# Patient Record
Sex: Female | Born: 1955 | Race: White | Hispanic: No | Marital: Married | Smoking: Never smoker
Health system: Southern US, Community
[De-identification: ages and names within clinical notes are randomized; demographics above are authoritative.]

## PROBLEM LIST (undated history)

## (undated) DIAGNOSIS — R519 Headache, unspecified: Secondary | ICD-10-CM

## (undated) DIAGNOSIS — G4730 Sleep apnea, unspecified: Secondary | ICD-10-CM

## (undated) DIAGNOSIS — R51 Headache: Secondary | ICD-10-CM

## (undated) DIAGNOSIS — I10 Essential (primary) hypertension: Secondary | ICD-10-CM

## (undated) DIAGNOSIS — E7800 Pure hypercholesterolemia, unspecified: Secondary | ICD-10-CM

## (undated) DIAGNOSIS — I8500 Esophageal varices without bleeding: Secondary | ICD-10-CM

## (undated) DIAGNOSIS — M1990 Unspecified osteoarthritis, unspecified site: Secondary | ICD-10-CM

## (undated) DIAGNOSIS — Z9289 Personal history of other medical treatment: Secondary | ICD-10-CM

## (undated) DIAGNOSIS — D649 Anemia, unspecified: Secondary | ICD-10-CM

## (undated) DIAGNOSIS — R0600 Dyspnea, unspecified: Secondary | ICD-10-CM

## (undated) DIAGNOSIS — M797 Fibromyalgia: Secondary | ICD-10-CM

## (undated) DIAGNOSIS — E039 Hypothyroidism, unspecified: Secondary | ICD-10-CM

## (undated) DIAGNOSIS — K7460 Unspecified cirrhosis of liver: Secondary | ICD-10-CM

## (undated) DIAGNOSIS — R011 Cardiac murmur, unspecified: Secondary | ICD-10-CM

## (undated) DIAGNOSIS — K219 Gastro-esophageal reflux disease without esophagitis: Secondary | ICD-10-CM

## (undated) DIAGNOSIS — Z9989 Dependence on other enabling machines and devices: Secondary | ICD-10-CM

## (undated) DIAGNOSIS — H9190 Unspecified hearing loss, unspecified ear: Secondary | ICD-10-CM

## (undated) HISTORY — PX: COLONOSCOPY: SHX174

## (undated) HISTORY — PX: TONSILLECTOMY: SUR1361

## (undated) HISTORY — PX: UPPER ENDOSCOPY W/ BANDING: SHX2603

## (undated) HISTORY — PX: WRIST FRACTURE SURGERY: SHX121

## (undated) HISTORY — PX: OTHER SURGICAL HISTORY: SHX169

## (undated) HISTORY — PX: INCONTINENCE SURGERY: SHX676

## (undated) HISTORY — PX: ABDOMINAL HYSTERECTOMY: SHX81

## (undated) HISTORY — PX: CARPAL TUNNEL RELEASE: SHX101

---

## 2010-03-08 ENCOUNTER — Encounter: Admission: RE | Admit: 2010-03-08 | Discharge: 2010-03-08 | Payer: Self-pay | Admitting: Sports Medicine

## 2012-06-08 ENCOUNTER — Other Ambulatory Visit: Payer: Self-pay | Admitting: Physician Assistant

## 2012-06-08 DIAGNOSIS — Z780 Asymptomatic menopausal state: Secondary | ICD-10-CM

## 2012-06-11 ENCOUNTER — Ambulatory Visit
Admission: RE | Admit: 2012-06-11 | Discharge: 2012-06-11 | Disposition: A | Discharge status: Home / Self Care | Payer: BC Managed Care – PPO | Source: Ambulatory Visit | Attending: Physician Assistant | Admitting: Physician Assistant

## 2012-06-11 DIAGNOSIS — Z780 Asymptomatic menopausal state: Secondary | ICD-10-CM

## 2015-01-26 ENCOUNTER — Emergency Department (HOSPITAL_COMMUNITY): Payer: BLUE CROSS/BLUE SHIELD

## 2015-01-26 ENCOUNTER — Encounter (HOSPITAL_COMMUNITY): Payer: Self-pay | Admitting: Emergency Medicine

## 2015-01-26 ENCOUNTER — Observation Stay (HOSPITAL_COMMUNITY)
Admission: EM | Admit: 2015-01-26 | Discharge: 2015-01-27 | Disposition: A | Discharge status: Home / Self Care | Payer: BLUE CROSS/BLUE SHIELD | Attending: Internal Medicine | Admitting: Internal Medicine

## 2015-01-26 DIAGNOSIS — E039 Hypothyroidism, unspecified: Secondary | ICD-10-CM | POA: Diagnosis not present

## 2015-01-26 DIAGNOSIS — E780 Pure hypercholesterolemia: Secondary | ICD-10-CM | POA: Diagnosis not present

## 2015-01-26 DIAGNOSIS — K219 Gastro-esophageal reflux disease without esophagitis: Secondary | ICD-10-CM | POA: Insufficient documentation

## 2015-01-26 DIAGNOSIS — R0789 Other chest pain: Secondary | ICD-10-CM | POA: Diagnosis not present

## 2015-01-26 DIAGNOSIS — R079 Chest pain, unspecified: Secondary | ICD-10-CM | POA: Diagnosis present

## 2015-01-26 DIAGNOSIS — G2581 Restless legs syndrome: Secondary | ICD-10-CM

## 2015-01-26 DIAGNOSIS — R740 Nonspecific elevation of levels of transaminase and lactic acid dehydrogenase [LDH]: Secondary | ICD-10-CM

## 2015-01-26 DIAGNOSIS — Z23 Encounter for immunization: Secondary | ICD-10-CM | POA: Diagnosis not present

## 2015-01-26 DIAGNOSIS — Z79899 Other long term (current) drug therapy: Secondary | ICD-10-CM

## 2015-01-26 DIAGNOSIS — R42 Dizziness and giddiness: Secondary | ICD-10-CM | POA: Diagnosis not present

## 2015-01-26 DIAGNOSIS — R0602 Shortness of breath: Secondary | ICD-10-CM | POA: Insufficient documentation

## 2015-01-26 DIAGNOSIS — I10 Essential (primary) hypertension: Secondary | ICD-10-CM | POA: Insufficient documentation

## 2015-01-26 DIAGNOSIS — Z880 Allergy status to penicillin: Secondary | ICD-10-CM | POA: Diagnosis not present

## 2015-01-26 DIAGNOSIS — M797 Fibromyalgia: Secondary | ICD-10-CM

## 2015-01-26 HISTORY — DX: Pure hypercholesterolemia, unspecified: E78.00

## 2015-01-26 HISTORY — DX: Essential (primary) hypertension: I10

## 2015-01-26 LAB — CBC
HCT: 42.4 % (ref 36.0–46.0)
HEMOGLOBIN: 15.1 g/dL — AB (ref 12.0–15.0)
MCH: 33.5 pg (ref 26.0–34.0)
MCHC: 35.6 g/dL (ref 30.0–36.0)
MCV: 94 fL (ref 78.0–100.0)
Platelets: 206 10*3/uL (ref 150–400)
RBC: 4.51 MIL/uL (ref 3.87–5.11)
RDW: 13.2 % (ref 11.5–15.5)
WBC: 6.7 10*3/uL (ref 4.0–10.5)

## 2015-01-26 LAB — BASIC METABOLIC PANEL
Anion gap: 10 (ref 5–15)
BUN: 9 mg/dL (ref 6–20)
CALCIUM: 9.6 mg/dL (ref 8.9–10.3)
CHLORIDE: 103 mmol/L (ref 101–111)
CO2: 26 mmol/L (ref 22–32)
Creatinine, Ser: 0.69 mg/dL (ref 0.44–1.00)
GFR calc non Af Amer: >60 mL/min (ref >60)
GLUCOSE: 106 mg/dL — AB (ref 65–99)
Potassium: 3.2 mmol/L — ABNORMAL LOW (ref 3.5–5.1)
SODIUM: 139 mmol/L (ref 135–145)

## 2015-01-26 LAB — I-STAT TROPONIN, ED: TROPONIN I, POC: 0 ng/mL (ref 0.00–0.08)

## 2015-01-26 LAB — TROPONIN I

## 2015-01-26 MED ORDER — GABAPENTIN 100 MG PO CAPS
100.0000 mg | ORAL_CAPSULE | Freq: Three times a day (TID) | ORAL | Status: DC
  Administered 2015-01-26: 100 mg via ORAL
  Filled 2015-01-26: qty 1

## 2015-01-26 MED ORDER — MORPHINE SULFATE 2 MG/ML IJ SOLN: 1.0000 mg | INTRAMUSCULAR | Status: DC | PRN

## 2015-01-26 MED ORDER — SODIUM CHLORIDE 0.9 % IJ SOLN
3.0000 mL | Freq: Two times a day (BID) | INTRAMUSCULAR | Status: DC
  Administered 2015-01-26 – 2015-01-27 (×2): 3 mL via INTRAVENOUS

## 2015-01-26 MED ORDER — ONDANSETRON HCL 4 MG PO TABS: 4.0000 mg | ORAL_TABLET | Freq: Four times a day (QID) | ORAL | Status: DC | PRN

## 2015-01-26 MED ORDER — PNEUMOCOCCAL VAC POLYVALENT 25 MCG/0.5ML IJ INJ
0.5000 mL | INJECTION | INTRAMUSCULAR | Status: AC
  Administered 2015-01-27: 0.5 mL via INTRAMUSCULAR
  Filled 2015-01-26: qty 0.5

## 2015-01-26 MED ORDER — GI COCKTAIL ~~LOC~~: 30.0000 mL | Freq: Three times a day (TID) | ORAL | Status: DC | PRN

## 2015-01-26 MED ORDER — POTASSIUM CHLORIDE CRYS ER 20 MEQ PO TBCR
40.0000 meq | EXTENDED_RELEASE_TABLET | Freq: Once | ORAL | Status: AC
  Administered 2015-01-26: 40 meq via ORAL
  Filled 2015-01-26: qty 2

## 2015-01-26 MED ORDER — ONDANSETRON HCL 4 MG/2ML IJ SOLN: 4.0000 mg | Freq: Four times a day (QID) | INTRAMUSCULAR | Status: DC | PRN

## 2015-01-26 MED ORDER — PANTOPRAZOLE SODIUM 40 MG PO TBEC
40.0000 mg | DELAYED_RELEASE_TABLET | Freq: Every day | ORAL | Status: DC
  Administered 2015-01-26 – 2015-01-27 (×2): 40 mg via ORAL
  Filled 2015-01-26 (×2): qty 1

## 2015-01-26 MED ORDER — LEVOTHYROXINE SODIUM 100 MCG PO TABS
100.0000 ug | ORAL_TABLET | Freq: Every day | ORAL | Status: DC
  Administered 2015-01-27: 100 ug via ORAL
  Filled 2015-01-26: qty 1

## 2015-01-26 MED ORDER — DULOXETINE HCL 60 MG PO CPEP
60.0000 mg | ORAL_CAPSULE | Freq: Every day | ORAL | Status: DC
  Administered 2015-01-27: 60 mg via ORAL
  Filled 2015-01-26: qty 1

## 2015-01-26 MED ORDER — ROPINIROLE HCL 1 MG PO TABS
4.0000 mg | ORAL_TABLET | Freq: Every day | ORAL | Status: DC
  Administered 2015-01-26: 4 mg via ORAL
  Filled 2015-01-26: qty 4

## 2015-01-26 MED ORDER — ENOXAPARIN SODIUM 40 MG/0.4ML ~~LOC~~ SOLN
40.0000 mg | SUBCUTANEOUS | Status: DC
  Administered 2015-01-26: 40 mg via SUBCUTANEOUS
  Filled 2015-01-26: qty 0.4

## 2015-01-26 MED ORDER — GI COCKTAIL ~~LOC~~
30.0000 mL | Freq: Once | ORAL | Status: AC
  Administered 2015-01-26: 30 mL via ORAL
  Filled 2015-01-26: qty 30

## 2015-01-26 MED ORDER — ASPIRIN 81 MG PO CHEW
81.0000 mg | CHEWABLE_TABLET | Freq: Every day | ORAL | Status: DC
  Administered 2015-01-27: 81 mg via ORAL
  Filled 2015-01-26: qty 1

## 2015-01-26 NOTE — ED Provider Notes (Signed)
CSN: [id number]     Arrival date & time 01/26/15  1117 History   First MD Initiated Contact with Patient 01/26/15 1203     Chief Complaint  Patient presents with  . Chest Pain     (Consider location/radiation/quality/duration/timing/severity/associated sxs/prior Treatment) HPI Sabrina Faulkner is a 59 y.o. female with hx of htn, hypercholesteremia, presents to ED with complaint of chest pain. Pt states pain started around 2am, states thought it was GERD initially. States she could not lay down anymore and had to get up. States was unable to go back to sleep. States pain lasted several hours but then improved. States recurrent several times through out the morning. Pt describes pain as "tightness and burning." she states she had some dizziness this morning which improved. States she feels a little short of breath. Denies LE swelling. No hx of cardiac problems. States she had stress test done about 4-5 years ago, she is not sure if it was normal or not but states "they told me I had a hole in my heart. "  Pt reports both of her parents passed away from MIs at around her age. Pt states she went to urgent care and they sent her here for "abnormal ECG."   Past Medical History  Diagnosis Date  . Hypercholesteremia   . Hypertension    History reviewed. No pertinent past surgical history. No family history on file. History  Substance Use Topics  . Smoking status: Never Smoker   . Smokeless tobacco: Not on file  . Alcohol Use: No   OB History    No data available     Review of Systems  Constitutional: Negative for fever and chills.  Respiratory: Positive for chest tightness and shortness of breath. Negative for cough.   Cardiovascular: Positive for chest pain. Negative for palpitations and leg swelling.  Gastrointestinal: Negative for nausea, vomiting, abdominal pain and diarrhea.  Genitourinary: Negative for dysuria, flank pain and pelvic pain.  Musculoskeletal: Negative for myalgias, neck  pain and neck stiffness.  Skin: Negative for rash.  Neurological: Negative for dizziness, weakness and headaches.  All other systems reviewed and are negative.     Allergies  Review of patient's allergies indicates not on file.  Home Medications   Prior to Admission medications   Not on File   BP 131/70 mmHg  Pulse 71  Temp(Src) 97.9 F (36.6 C) (Oral)  Resp 19  Ht 5' (1.524 m)  Wt 150 lb (68.04 kg)  BMI 29.30 kg/m2  SpO2 95% Physical Exam  Constitutional: She is oriented to person, place, and time. She appears well-developed and well-nourished. No distress.  HENT:  Head: Normocephalic.  Eyes: Conjunctivae are normal.  Neck: Neck supple.  Cardiovascular: Normal rate, regular rhythm and normal heart sounds.   Pulmonary/Chest: Effort normal and breath sounds normal. No respiratory distress. She has no wheezes. She has no rales. She exhibits no tenderness.  Abdominal: Soft. Bowel sounds are normal. She exhibits no distension. There is no tenderness. There is no rebound.  Musculoskeletal: She exhibits no edema.  Neurological: She is alert and oriented to person, place, and time.  Skin: Skin is warm and dry.  Psychiatric: She has a normal mood and affect. Her behavior is normal.  Nursing note and vitals reviewed.   ED Course  Procedures (including critical care time) Labs Review Labs Reviewed  BASIC METABOLIC PANEL - Abnormal; Notable for the following:    Potassium 3.2 (*)    Glucose, Bld 106 (*)  All other components within normal limits  CBC - Abnormal; Notable for the following:    Hemoglobin 15.1 (*)    All other components within normal limits  BASIC METABOLIC PANEL - Abnormal; Notable for the following:    Potassium 3.4 (*)    Chloride 100 (*)    Glucose, Bld 123 (*)    All other components within normal limits  LIPID PANEL - Abnormal; Notable for the following:    Cholesterol 296 (*)    Triglycerides 161 (*)    LDL Cholesterol 223 (*)    All other  components within normal limits  HEPATIC FUNCTION PANEL - Abnormal; Notable for the following:    Albumin 3.4 (*)    AST 170 (*)    ALT 180 (*)    Alkaline Phosphatase 171 (*)    All other components within normal limits  TSH  TROPONIN I  TROPONIN I  CBC  HEMOGLOBIN A1C  HEPATITIS PANEL, ACUTE  I-STAT TROPOININ, ED    Imaging Review Dg Chest 2 View  01/26/2015   CLINICAL DATA:  59 year old female with generalized chest pain and shortness of breath.  EXAM: CHEST  2 VIEW  COMPARISON:  Prior chest x-ray 06/19/2011  FINDINGS: The lungs are clear and negative for focal airspace consolidation, pulmonary edema or suspicious pulmonary nodule. No pleural effusion or pneumothorax. Cardiac and mediastinal contours are within normal limits. No acute fracture or lytic or blastic osseous lesions. The visualized upper abdominal bowel gas pattern is unremarkable. Trace atherosclerotic calcification present a within the abdominal aorta.  IMPRESSION: No active cardiopulmonary disease.  Incidental note made of atherosclerotic calcifications along the course of the abdominal aorta.   Electronically Signed   By: Malachy Moan M.D.   On: 01/26/2015 11:52     EKG Interpretation   Date/Time:  Friday January 26 2015 11:21:54 EDT Ventricular Rate:  78 PR Interval:  158 QRS Duration: 82 QT Interval:  448 QTC Calculation: 510 R Axis:   20 Text Interpretation:  Normal sinus rhythm Septal infarct , age  undetermined Prolonged QT Abnormal ECG Confirmed by Fayrene Fearing  MD, MARK  [phone]) on 01/26/2015 12:37:07 PM      MDM   Final diagnoses:  Chest pain, unspecified chest pain type    Pt with CP, presents to ED from UC for abnormal ECG. Pain onst 2 am, intermittent since. Pt does have multiple risk factors including htn, high cholesterol, father died from MI at her age. Pain is however somewhat atypical. No acute changes on ECG. Will get labs, cxr, pr received asa by ems.    Labs and cxr unremarkable. Tried gi  cocktail for possible GERD which did not help. Given risk factors, will admit for further evaluation, observation.   Filed Vitals:   01/26/15 1744 01/26/15 2011 01/27/15 0537 01/27/15 1000  BP: 140/75 121/62 108/72 133/67  Pulse: 88 84 79 79  Temp: 98.2 F (36.8 C) 98 F (36.7 C) 98.6 F (37 C)   TempSrc: Oral Oral Oral   Resp: Height:      Weight:      SpO2: 94% 93% 95% 94%      Jaynie Crumble, PA-C 01/27/15 1607  Rolland Porter, MD 02/02/15 [phone]

## 2015-01-26 NOTE — H&P (Signed)
Date: 01/26/2015               Patient Name:  Sabrina Faulkner MRN: [id number]  DOB: 01/21/1956 Age / Sex: 59 y.o., female   PCP: No primary care provider on file.         Medical Service: Internal Medicine Teaching Service         Attending Physician: Dr. Inez Catalina, MD    First Contact: Dr. Isabella Bowens Pager: [phone]  Second Contact: Dr. Yetta Barre Pager: [phone]       After Hours (After 5p/  First Contact Pager: [phone]  weekends / holidays): Second Contact Pager: [phone]   Chief Complaint: Chest pain  History of Present Illness: Ms. Ernesta Trabert is a 59 year old woman with history of HTN, HLD, fibromyalgia presenting with chest pain. The pain started this morning around 2AM. She describes it as stabbing in nature and lasted 4 hours. It is located throughout her chest with no radiation. She has had intermittent episodes of recurrence of this pain lasting a few minutes each time. She also now has chest tightness that comes and goes. She felt better with standing up. Worse with deep breaths. No feelings of acid reflux with this. No recent anxiety/stressors. She had associated diaphoresis and shortness of breath. No recent travel or prolonged immobilization. Denies leg/calf pain. She has a nonproductive cough that started yesterday. She has dyspnea with exertion that has been gradually worsening. A little chest discomfort with exertion as well. Denies orthopnea or PND. Denies fevers, chills, vision changes, emesis, diarrhea, hematochezia, melena, dysuria, hematuria, edema, paresthesias, weakness. No history of VTE.  She has a family history of father who had MI at age 18 and mother had CHF. She reports she's had a stress test several years ago but uncertain what the results were.   Meds: No current facility-administered medications for this encounter.   Current Outpatient Prescriptions  Medication Sig Dispense Refill  . aspirin 81 MG chewable tablet Chew 81 mg by mouth daily.    Marland Kitchen  atenolol-chlorthalidone (TENORETIC) 50-25 MG per tablet Take 0.5 tablets by mouth daily.    Marland Kitchen azelastine (ASTELIN) 0.1 % nasal spray Place 1 spray into both nostrils 2 (two) times daily. Use in each nostril as directed    . DULoxetine (CYMBALTA) 60 MG capsule Take 60 mg by mouth daily.    Marland Kitchen esomeprazole (NEXIUM) 40 MG capsule Take 40 mg by mouth daily.    Marland Kitchen ezetimibe (ZETIA) 10 MG tablet Take 10 mg by mouth daily.    Marland Kitchen gabapentin (NEURONTIN) 100 MG capsule Take 100 mg by mouth 3 (three) times daily.    Marland Kitchen levothyroxine (SYNTHROID, LEVOTHROID) 100 MCG tablet Take 100 mcg by mouth daily before breakfast.    . oxycodone (OXY-IR) 5 MG capsule Take 5 mg by mouth every 6 (six) hours.    . potassium chloride SA (K-DUR,KLOR-CON) 20 MEQ tablet Take 20 mEq by mouth daily.    Marland Kitchen rOPINIRole (REQUIP) 4 MG tablet Take 4 mg by mouth at bedtime.    . simvastatin (ZOCOR) 10 MG tablet Take 10 mg by mouth daily.     Allergies: Allergies as of 01/26/2015 - Review Complete 01/26/2015  Allergen Reaction Noted  . Penicillins Rash 01/26/2015   Past Medical History  Diagnosis Date  . Hypercholesteremia   . Hypertension    History reviewed. No pertinent past surgical history. No family history on file. History   Social History  . Marital Status: Married    Spouse Name:  N/A  . Number of Children: N/A  . Years of Education: N/A   Occupational History  . Not on file.   Social History Main Topics  . Smoking status: Never Smoker   . Smokeless tobacco: Not on file  . Alcohol Use: No  . Drug Use: No  . Sexual Activity: Not on file   Other Topics Concern  . Not on file   Social History Narrative  . No narrative on file    Review of Systems: Constitutional: no fevers/chills Eyes: no vision changes Ears, nose, mouth, throat, and face: +cough Respiratory: +shortness of breath Cardiovascular: no chest pain Gastrointestinal: no nausea/vomiting, +chronic occasional cramping abdominal pain, no  constipation, no diarrhea Genitourinary: no dysuria, no hematuria Integument: no rash Hematologic/lymphatic: no bleeding/bruising, no edema Musculoskeletal: +fibromyalgia Neurological: no paresthesias, no weakness  Physical Exam: Blood pressure 119/78, pulse 70, temperature 97.9 F (36.6 C), temperature source Oral, resp. rate 22, height 5' (1.524 m), weight 150 lb (68.04 kg), SpO2 95 %. General Apperance: NAD Head: Normocephalic, atraumatic Eyes: PERRL, EOMI, anicteric sclera Ears: Normal external ear canal Nose: Nares normal, septum midline, mucosa normal Throat: Lips, mucosa and tongue normal  Neck: Supple, trachea midline Back: No tenderness or bony abnormality  Lungs: Clear to auscultation bilaterally. No wheezes, rhonchi or rales. Breathing comfortably Chest Wall: Mild tenderness to palpation, no deformity Heart: Regular rate and rhythm, no murmur/rub/gallop Abdomen: Soft, nontender, nondistended, no rebound/guarding Extremities: Normal, atraumatic, warm and well perfused, no edema Pulses: 2+ throughout Skin: No rashes or lesions Neurologic: Alert and oriented x 3. CNII-XII intact. Normal strength and sensation  Lab results: Basic Metabolic Panel:  Recent Labs  16/10/96 1130  NA 139  K 3.2*  CL 103  CO2 26  GLUCOSE 106*  BUN 9  CREATININE 0.69  CALCIUM 9.6   CBC:  Recent Labs  01/26/15 1130  WBC 6.7  HGB 15.1*  HCT 42.4  MCV 94.0  PLT 206   Cardiac Enzymes: No results for input(s): CKTOTAL, CKMB, CKMBINDEX, TROPONINI in the last 72 hours.  Urine Drug Screen: Drugs of Abuse  No results found for: LABOPIA, COCAINSCRNUR, LABBENZ, AMPHETMU, THCU, LABBARB   Imaging results:  Dg Chest 2 View  01/26/2015   CLINICAL DATA:  59 year old female with generalized chest pain and shortness of breath.  EXAM: CHEST  2 VIEW  COMPARISON:  Prior chest x-ray 06/19/2011  FINDINGS: The lungs are clear and negative for focal airspace consolidation, pulmonary edema or  suspicious pulmonary nodule. No pleural effusion or pneumothorax. Cardiac and mediastinal contours are within normal limits. No acute fracture or lytic or blastic osseous lesions. The visualized upper abdominal bowel gas pattern is unremarkable. Trace atherosclerotic calcification present a within the abdominal aorta.  IMPRESSION: No active cardiopulmonary disease.  Incidental note made of atherosclerotic calcifications along the course of the abdominal aorta.   Electronically Signed   By: Malachy Moan M.D.   On: 01/26/2015 11:52    Other results: EKG: Normal sinus rhythm, T wave inversions in aVL, V1. Q wave in lead III. QTc  Assessment & Plan by Problem: Active Problems:   Chest pain  Chest pain: Some tenderness to palpation. CXR with no acute cardiopulmonary process. Do not suspect PE as Wells score 0 and Geneva score 0 making her low risk for PE. Initial troponin negative and EKG with nonspecific abnormalities and without acute ischemic changes. Her TIMI score is 2 (8% risk at 14 days). HEART score 5.  -repeat EKG in AM -  trend troponins -lipid panel -hemoglobin A1c  -morphine  Q3hr prn pain -nitrostat SL 0.4mg  Q83min prn chest pain -tele -Protonix  daily -GI cocktail TID prn -Echo -Consider consult cardiology in AM -ASA  daily  Hypertension: BP 119/78-136/66 in ED. On atenolol-chlorthalidone 50-25 mg 0.5mg  daily at home. -Home medication held -Continue to monitor  Hyperlipidemia: On zetia  daily at home. Simvastatin  daily held by PCP for elevated liver enzymes per patient.  Hypothyroidism:  -Continue home Synthroid daily -Check TSH in AM  Fibromyalgia: -Continue home duloxetine  daily -Continue home gabapentin  TID  Restless leg syndrome: Continue home ropinirole  QHS  FEN:  -Heart healthy, NPO after midnight -Replete K 3.2 with KCl PO x 1  VTE ppx: Lovenox  Dispo: Disposition is deferred at this time, awaiting  improvement of current medical problems. Anticipated discharge in approximately 1-2 day(s).   The patient does not have a current PCP (No primary care provider on file.) and does not need an [facility] hospital follow-up appointment after discharge.  The patient does not have transportation limitations that hinder transportation to clinic appointments.  Signed: Lora Paula, MD  01/26/2015, 1:17 PM

## 2015-01-26 NOTE — ED Notes (Signed)
Pt. Stated, I woke up this morning about 0200 with burning in my chest , and feeling a lot of pressure

## 2015-01-27 ENCOUNTER — Observation Stay (HOSPITAL_COMMUNITY): Payer: BLUE CROSS/BLUE SHIELD

## 2015-01-27 DIAGNOSIS — E780 Pure hypercholesterolemia: Secondary | ICD-10-CM | POA: Diagnosis not present

## 2015-01-27 DIAGNOSIS — R079 Chest pain, unspecified: Secondary | ICD-10-CM | POA: Diagnosis not present

## 2015-01-27 DIAGNOSIS — I10 Essential (primary) hypertension: Secondary | ICD-10-CM | POA: Diagnosis not present

## 2015-01-27 DIAGNOSIS — R0789 Other chest pain: Secondary | ICD-10-CM | POA: Diagnosis not present

## 2015-01-27 DIAGNOSIS — R0602 Shortness of breath: Secondary | ICD-10-CM | POA: Diagnosis not present

## 2015-01-27 LAB — BASIC METABOLIC PANEL
Anion gap: 8 (ref 5–15)
BUN: 12 mg/dL (ref 6–20)
CHLORIDE: 100 mmol/L — AB (ref 101–111)
CO2: 28 mmol/L (ref 22–32)
CREATININE: 0.72 mg/dL (ref 0.44–1.00)
Calcium: 9 mg/dL (ref 8.9–10.3)
GFR calc Af Amer: >60 mL/min (ref >60)
Glucose, Bld: 123 mg/dL — ABNORMAL HIGH (ref 65–99)
Potassium: 3.4 mmol/L — ABNORMAL LOW (ref 3.5–5.1)
Sodium: 136 mmol/L (ref 135–145)

## 2015-01-27 LAB — HEPATIC FUNCTION PANEL
ALBUMIN: 3.4 g/dL — AB (ref 3.5–5.0)
ALT: 180 U/L — ABNORMAL HIGH (ref 14–54)
AST: 170 U/L — ABNORMAL HIGH (ref 15–41)
Alkaline Phosphatase: 171 U/L — ABNORMAL HIGH (ref 38–126)
BILIRUBIN TOTAL: 0.6 mg/dL (ref 0.3–1.2)
Bilirubin, Direct: 0.1 mg/dL (ref 0.1–0.5)
Indirect Bilirubin: 0.5 mg/dL (ref 0.3–0.9)
Total Protein: 6.6 g/dL (ref 6.5–8.1)

## 2015-01-27 LAB — LIPID PANEL
CHOL/HDL RATIO: 7.2 ratio
Cholesterol: 296 mg/dL — ABNORMAL HIGH (ref 0–200)
HDL: 41 mg/dL (ref >40)
LDL Cholesterol: 223 mg/dL — ABNORMAL HIGH (ref 0–99)
TRIGLYCERIDES: 161 mg/dL — AB (ref <150)
VLDL: 32 mg/dL (ref 0–40)

## 2015-01-27 LAB — CBC
HEMATOCRIT: 42.3 % (ref 36.0–46.0)
HEMOGLOBIN: 14.5 g/dL (ref 12.0–15.0)
MCH: 32.8 pg (ref 26.0–34.0)
MCHC: 34.3 g/dL (ref 30.0–36.0)
MCV: 95.7 fL (ref 78.0–100.0)
Platelets: 190 10*3/uL (ref 150–400)
RBC: 4.42 MIL/uL (ref 3.87–5.11)
RDW: 13.5 % (ref 11.5–15.5)
WBC: 6.2 10*3/uL (ref 4.0–10.5)

## 2015-01-27 LAB — TSH: TSH: 1.505 u[IU]/mL (ref 0.350–4.500)

## 2015-01-27 MED ORDER — PERFLUTREN LIPID MICROSPHERE
1.0000 mL | INTRAVENOUS | Status: AC | PRN
Start: 2015-01-27 — End: 2015-01-27
  Administered 2015-01-27: 2 mL via INTRAVENOUS
  Filled 2015-01-27: qty 10

## 2015-01-27 MED ORDER — POTASSIUM CHLORIDE CRYS ER 20 MEQ PO TBCR
40.0000 meq | EXTENDED_RELEASE_TABLET | Freq: Once | ORAL | Status: AC
  Administered 2015-01-27: 40 meq via ORAL
  Filled 2015-01-27: qty 2

## 2015-01-27 MED ORDER — POTASSIUM CHLORIDE CRYS ER 20 MEQ PO TBCR
20.0000 meq | EXTENDED_RELEASE_TABLET | Freq: Once | ORAL | Status: AC
  Administered 2015-01-27: 20 meq via ORAL
  Filled 2015-01-27: qty 1

## 2015-01-27 NOTE — Progress Notes (Signed)
  Echocardiogram 2D Echocardiogram with Definity has been performed.  Sabrina Faulkner 01/27/2015, 10:09 AM

## 2015-01-27 NOTE — Progress Notes (Signed)
Subjective: No acute events overnight. She is doing well overall. Reports that she has no further episodes of chest pain since she arrived in her room. Denies SOB.  Objective: Vital signs in last 24 hours: Filed Vitals:   01/26/15 1744 01/26/15 2011 01/27/15 0537 01/27/15 1000  BP: 140/75 121/62 108/72 133/67  Pulse: 88 84 79 79  Temp: 98.2 F (36.8 C) 98 F (36.7 C) 98.6 F (37 C)   TempSrc: Oral Oral Oral   Resp: _0 Height:      Weight:      SpO2: 94% 93% 95% 94%   Weight change:   Intake/Output Summary (Last 24 hours) at 01/27/15 1122 Last data filed at 01/26/15 2100  Gross per 24 hour  Intake    120 ml  Output      0 ml  Net    120 ml   General Apperance: NAD HEENT: Normocephalic, atraumatic, PERRL, EOMI, anicteric sclera Neck: Supple, trachea midline Lungs: Clear to auscultation bilaterally. No wheezes, rhonchi or rales. Breathing comfortably Heart: Regular rate and rhythm Abdomen: Soft, nontender, nondistended, no rebound/guarding Extremities: Normal, atraumatic, warm and well perfused, no edema Pulses: 2+ throughout Skin: No rashes or lesions Neurologic: Alert and oriented x 3. CNII-XII intact. Normal strength and sensation  Lab Results: Basic Metabolic Panel:  Recent Labs Lab 01/26/15 1130 01/27/15 0322  NA 139 136  K 3.2* 3.4*  CL 103 100*  CO2 26 28  GLUCOSE 106* 123*  BUN 9 12  CREATININE 0.69 0.72  CALCIUM 9.6 9.0   Liver Function Tests:  Recent Labs Lab 01/27/15 0759  AST 170*  ALT 180*  ALKPHOS 171*  BILITOT 0.6  PROT 6.6  ALBUMIN 3.4*   CBC:  Recent Labs Lab 01/26/15 1130 01/27/15 0322  WBC 6.7 6.2  HGB 15.1* 14.5  HCT 42.4 42.3  MCV 94.0 95.7  PLT 206 190   Cardiac Enzymes:  Recent Labs Lab 01/26/15 1926 01/26/15 2235  TROPONINI <0.03 <0.03   Hemoglobin A1C: No results for input(s): HGBA1C in the last 168 hours.   Fasting Lipid Panel:  Recent Labs Lab 01/27/15 0322  CHOL 296*  HDL 41    LDLCALC 223*  TRIG 161*  CHOLHDL 7.2   Thyroid Function Tests:  Recent Labs Lab 01/27/15 0322  TSH 1.505   Studies/Results: Dg Chest 2 View  01/26/2015   CLINICAL DATA:  59 year old female with generalized chest pain and shortness of breath.  EXAM: CHEST  2 VIEW  COMPARISON:  Prior chest x-ray 06/19/2011  FINDINGS: The lungs are clear and negative for focal airspace consolidation, pulmonary edema or suspicious pulmonary nodule. No pleural effusion or pneumothorax. Cardiac and mediastinal contours are within normal limits. No acute fracture or lytic or blastic osseous lesions. The visualized upper abdominal bowel gas pattern is unremarkable. Trace atherosclerotic calcification present a within the abdominal aorta.  IMPRESSION: No active cardiopulmonary disease.  Incidental note made of atherosclerotic calcifications along the course of the abdominal aorta.   Electronically Signed   By: Jacqulynn Cadet M.D.   On: 01/26/2015 11:52   Medications: I have reviewed the patient's current medications. Scheduled Meds: . aspirin  81 mg Oral Daily  . DULoxetine  60 mg Oral Daily  . enoxaparin (LOVENOX) injection  40 mg Subcutaneous Q24H  . gabapentin  100 mg Oral TID  . levothyroxine  100 mcg Oral QAC breakfast  . pantoprazole  40 mg Oral Daily  . pneumococcal 23 valent vaccine  0.5 mL Intramuscular Tomorrow-1000  . rOPINIRole  4 mg Oral QHS  . sodium chloride  3 mL Intravenous Q12H   Continuous Infusions:  PRN Meds:.gi cocktail, morphine injection, ondansetron **OR** ondansetron (ZOFRAN) IV, perflutren lipid microspheres (DEFINITY) IV suspension Assessment/Plan:  Atypical Chest pain: EKG unchanged this morning and troponins negative x 3. Her TIMI score is 2 (8% risk at 14 days). HEART score 5. Cholesterol 296, triglycerides 161, LDL 223, HDL 41.  -hemoglobin A1c pending -morphine 49m Q3hr prn pain -nitrostat SL 0.441mQ5m60mprn chest pain -tele -Protonix 47m47mily -GI cocktail TID  prn -Echo pending -ASA 81mg35mly  Transaminitis: AST 170, ALT 180 and alk phos 171. Bilirubin within normal limits. -Avoid statin -Acute hepatitis panel pending -Ongoing workup by PCP  Hypertension: BP 119/78-136/66 in ED. On atenolol-chlorthalidone 50-25 mg 0.5mg d61my at home. -Home medication held -Continue to monitor  Hyperlipidemia: On zetia 10mg d44m at home. Simvastatin 10mg da98mheld by PCP for elevated liver enzymes per patient. Has not been able to afford Zetia. She will need this followed up by her PCP.  Hypothyroidism:  -Continue home Synthroid 100mcg da24m-Check TSH in AM  Fibromyalgia: -Continue home duloxetine 60mg dail32montinue home gabapentin 100mg TID  78mless leg syndrome: Continue home ropinirole 4mg QHS  FE47m -Heart healthy  VTE ppx: Lovenox  Dispo: Likely home today  The patient does have a current PCP (No primary care provider on file.) and [facility].  The patient does not have transportation limitations that hinder transportation to clinic appointments.  .Services Needed at time of discharge: Y = Yes, Blank = No PT:   OT:   RN:   Equipment:   Other:     LOS: 1 day   Obera Stauch T KMilagros Loll16, 11:22 AM

## 2015-01-27 NOTE — Discharge Instructions (Signed)
You were admitted for chest pain. Please talk to your primary care physician about starting a medication that lower your cholesterol. Also please talk to your primary care physician about referring you for a stress test.    Chest Pain It is often hard to give a diagnosis for the cause of chest pain. There is always a chance that your pain could be related to something serious, such as a heart attack or a blood clot in the lungs. You need to follow up with your doctor. HOME CARE  If antibiotic medicine was given, take it as directed by your doctor. Finish the medicine even if you start to feel better.  For the next few days, avoid activities that bring on chest pain. Continue physical activities as told by your doctor.  Do not use any tobacco products. This includes cigarettes, chewing tobacco, and e-cigarettes.  Avoid drinking alcohol.  Only take medicine as told by your doctor.  Follow your doctor's suggestions for more testing if your chest pain does not go away.  Keep all doctor visits you made. GET HELP IF:  Your chest pain does not go away, even after treatment.  You have a rash with blisters on your chest.  You have a fever. GET HELP RIGHT AWAY IF:   You have more pain or pain that spreads to your arm, neck, jaw, back, or belly (abdomen).  You have shortness of breath.  You cough more than usual or cough up blood.  You have very bad back or belly pain.  You feel sick to your stomach (nauseous) or throw up (vomit).  You have very bad weakness.  You pass out (faint).  You have chills. This is an emergency. Do not wait to see if the problems will go away. Call your local emergency services (911 in [place] Do not drive yourself to the hospital. MAKE SURE YOU:   Understand these instructions.  Will watch your condition.  Will get help right away if you are not doing well or get worse. Document Released: 12/03/2007 Document Revised: 06/21/2013 Document Reviewed:  12/03/2007 [facility] Patient Information 2015 [place], [place]. This information is not intended to replace advice given to you by your health care provider. Make sure you discuss any questions you have with your health care provider.

## 2015-01-28 LAB — HEPATITIS PANEL, ACUTE
HCV Ab: <0.1 s/co ratio (ref 0.0–0.9)
Hep A IgM: NEGATIVE
Hep B C IgM: NEGATIVE
Hepatitis B Surface Ag: NEGATIVE

## 2015-01-28 NOTE — Discharge Summary (Signed)
Name: Sabrina Faulkner MRN: [id number] DOB: 1956-04-07 59 y.o. PCP: No primary care provider on file.  Date of Admission: 01/26/2015 11:59 AM Date of Discharge: 01/27/2015 Attending Physician: Gilles Chiquito, MD  Discharge Diagnosis: 1. Chest pain  Discharge Medications:   Medication List    STOP taking these medications        simvastatin 10 MG tablet  Commonly known as:  ZOCOR      TAKE these medications        aspirin 81 MG chewable tablet  Chew 81 mg by mouth daily.     atenolol-chlorthalidone 50-25 MG per tablet  Commonly known as:  TENORETIC  Take 0.5 tablets by mouth daily.     azelastine 0.1 % nasal spray  Commonly known as:  ASTELIN  Place 1 spray into both nostrils 2 (two) times daily. Use in each nostril as directed     DULoxetine 60 MG capsule  Commonly known as:  CYMBALTA  Take 60 mg by mouth daily.     esomeprazole 40 MG capsule  Commonly known as:  NEXIUM  Take 40 mg by mouth daily.     ezetimibe 10 MG tablet  Commonly known as:  ZETIA  Take 10 mg by mouth daily.     gabapentin 100 MG capsule  Commonly known as:  NEURONTIN  Take 100 mg by mouth 3 (three) times daily.     levothyroxine 100 MCG tablet  Commonly known as:  SYNTHROID, LEVOTHROID  Take 100 mcg by mouth daily before breakfast.     oxycodone 5 MG capsule  Commonly known as:  OXY-IR  Take 5 mg by mouth every 6 (six) hours.     potassium chloride SA 20 MEQ tablet  Commonly known as:  K-DUR,KLOR-CON  Take 20 mEq by mouth daily.     rOPINIRole 4 MG tablet  Commonly known as:  REQUIP  Take 4 mg by mouth at bedtime.        Disposition and follow-up:   [name] was discharged from [facility] in Stable condition.  At the hospital follow up visit please address:  1.  Hyperlipidemia: Patient reports she has been unable to afford her Zetia. She will need assistance in therapies to lower her lipids.  Chest pain: Echo with normal systomic function. Some basal  septal prominence LV outflow tract appears narrow. May consider TEE for better visualization of aortic valve morphology. Recommend stress testing as outpatient.  2.  Labs / imaging needed at time of follow-up: none  3.  Pending labs/ test needing follow-up: Hgb A1c  Follow-up Appointments:     Follow-up Information    Follow up with Octavio Graves, DO. Schedule an appointment as soon as possible for a visit in 1 week.   Why:  Please call and schedule a follow up appointment with your primary care provider within the next 1-2 weeks.      Discharge Instructions: Discharge Instructions    Call MD for:  difficulty breathing, headache or visual disturbances    Complete by:  As directed      Call MD for:  persistant dizziness or light-headedness    Complete by:  As directed      Call MD for:  persistant nausea and vomiting    Complete by:  As directed      Call MD for:  severe uncontrolled pain    Complete by:  As directed      Call MD for:  temperature >100.4  Complete by:  As directed      Diet - low sodium heart healthy    Complete by:  As directed      Increase activity slowly    Complete by:  As directed            Consultations: None  Procedures Performed:  Dg Chest 2 View  01/26/2015   CLINICAL DATA:  59 year old female with generalized chest pain and shortness of breath.  EXAM: CHEST  2 VIEW  COMPARISON:  Prior chest x-ray 06/19/2011  FINDINGS: The lungs are clear and negative for focal airspace consolidation, pulmonary edema or suspicious pulmonary nodule. No pleural effusion or pneumothorax. Cardiac and mediastinal contours are within normal limits. No acute fracture or lytic or blastic osseous lesions. The visualized upper abdominal bowel gas pattern is unremarkable. Trace atherosclerotic calcification present a within the abdominal aorta.  IMPRESSION: No active cardiopulmonary disease.  Incidental note made of atherosclerotic calcifications along the course of the  abdominal aorta.   Electronically Signed   By: Jacqulynn Cadet M.D.   On: 01/26/2015 11:52   2D Echo:  Study Conclusions  - Left ventricle: Some basal septal prominence LVOT appears narrow with small resting gradient. Cannot r/o subvalvular disease. Aortic valve morphology not well visualized consider TEE to r/o web and further assess if clinically indicated. The cavity size was normal. Wall thickness was increased in a pattern of mild LVH. Systolic function was normal. The estimated ejection fraction was in the range of 60% to 65%. - Left atrium: The atrium was mildly dilated. - Atrial septum: No defect or patent foramen ovale was identified.  Transthoracic echocardiography. M-mode, complete 2D, spectral Doppler, and color Doppler. Birthdate: Patient birthdate: [birth year]. Age: Patient is 59 yr old. Sex: Gender: female. BMI: 29.3 kg/m^2. Blood pressure:   108/72 Patient status: Inpatient. Study date: Study date: 01/27/2015. Study time: 09:11 AM. Location: Bedside.   Admission HPI: Ms. [name] is a 59 year old woman with history of HTN, HLD, fibromyalgia presenting with chest pain. The pain started this morning around 2AM. She describes it as stabbing in nature and lasted 4 hours. It is located throughout her chest with no radiation. She has had intermittent episodes of recurrence of this pain lasting a few minutes each time. She also now has chest tightness that comes and goes. She felt better with standing up. Worse with deep breaths. No feelings of acid reflux with this. No recent anxiety/stressors. She had associated diaphoresis and shortness of breath. No recent travel or prolonged immobilization. Denies leg/calf pain. She has a nonproductive cough that started yesterday. She has dyspnea with exertion that has been gradually worsening. A little chest discomfort with exertion as well. Denies orthopnea or PND. Denies fevers, chills, vision changes,  emesis, diarrhea, hematochezia, melena, dysuria, hematuria, edema, paresthesias, weakness. No history of VTE.  She has a family history of father who had MI at age 72 and mother had CHF. She reports she's had a stress test several years ago but uncertain what the results were.   Hospital Course by problem list: Active Problems:   Chest pain   Pain in the chest   1. Chest pain: Some tenderness to palpation. CXR with no acute cardiopulmonary process. Do not suspect PE as Wells score 0 and Geneva score 0 making her low risk for PE. Initial troponin negative and EKG with nonspecific abnormalities and without acute ischemic changes. Her TIMI score is 2 (8% risk at 14 days). HEART score 5. She was admitted  for further evaluation. EKG unchanged the following morning and troponins negative x 3. Cholesterol 296, triglycerides 161, LDL 223, HDL 41. She was taken off of her statin by her PCP due to elevated liver enzymes. Hepatic function panel here withAST 170, ALT 180 and alk phos 171. Bilirubin within normal limits. Echo with normal systomic function. Some basal septal prominence LV outflow tract appears narrow. May consider TEE for better visualization of aortic valve morphology. She was continued on PPI daily and ASA 64m daily. She was instructed to follow up with her PCP. Recommend stress testing as outpatient.  Discharge Vitals:   BP 133/67 mmHg  Pulse 79  Temp(Src) 98.6 F (37 C) (Oral)  Resp 16  Ht 5' (1.524 m)  Wt 150 lb (68.04 kg)  BMI 29.30 kg/m2  SpO2 94%  Discharge Labs:  Results for orders placed or performed during the hospital encounter of 01/26/15 (from the past 72 hour(s))  Basic metabolic panel     Status: Abnormal   Collection Time: 01/26/15 11:30 AM  Result Value Ref Range   Sodium 139 135 - 145 mmol/L   Potassium 3.2 (L) 3.5 - 5.1 mmol/L   Chloride 103 101 - 111 mmol/L   CO2 26 22 - 32 mmol/L   Glucose, Bld 106 (H) 65 - 99 mg/dL   BUN 9 6 - 20 mg/dL   Creatinine, Ser  0.69 0.44 - 1.00 mg/dL   Calcium 9.6 8.9 - 10.3 mg/dL   GFR calc non Af Amer >60 >60 mL/min   GFR calc Af Amer >60 >60 mL/min    Comment: (NOTE) The eGFR has been calculated using the CKD EPI equation. This calculation has not been validated in all clinical situations. eGFR's persistently <60 mL/min signify possible Chronic Kidney Disease.    Anion gap 10 5 - 15  CBC     Status: Abnormal   Collection Time: 01/26/15 11:30 AM  Result Value Ref Range   WBC 6.7 4.0 - 10.5 K/uL   RBC 4.51 3.87 - 5.11 MIL/uL   Hemoglobin 15.1 (H) 12.0 - 15.0 g/dL   HCT 42.4 36.0 - 46.0 %   MCV 94.0 78.0 - 100.0 fL   MCH 33.5 26.0 - 34.0 pg   MCHC 35.6 30.0 - 36.0 g/dL   RDW 13.2 11.5 - 15.5 %   Platelets 206 150 - 400 K/uL  I-stat troponin, ED     Status: None   Collection Time: 01/26/15 11:39 AM  Result Value Ref Range   Troponin i, poc 0.00 0.00 - 0.08 ng/mL   Comment 3            Comment: Due to the release kinetics of cTnI, a negative result within the first hours of the onset of symptoms does not rule out myocardial infarction with certainty. If myocardial infarction is still suspected, repeat the test at appropriate intervals.   Troponin I     Status: None   Collection Time: 01/26/15  7:26 PM  Result Value Ref Range   Troponin I <0.03 <0.031 ng/mL    Comment:        NO INDICATION OF MYOCARDIAL INJURY.   Troponin I     Status: None   Collection Time: 01/26/15 10:35 PM  Result Value Ref Range   Troponin I <0.03 <0.031 ng/mL    Comment:        NO INDICATION OF MYOCARDIAL INJURY.   TSH     Status: None   Collection Time: 01/27/15  3:22  AM  Result Value Ref Range   TSH 1.505 0.350 - 4.500 uIU/mL  Basic metabolic panel     Status: Abnormal   Collection Time: 01/27/15  3:22 AM  Result Value Ref Range   Sodium 136 135 - 145 mmol/L   Potassium 3.4 (L) 3.5 - 5.1 mmol/L   Chloride 100 (L) 101 - 111 mmol/L   CO2 28 22 - 32 mmol/L   Glucose, Bld 123 (H) 65 - 99 mg/dL   BUN 12 6 - 20  mg/dL   Creatinine, Ser 0.72 0.44 - 1.00 mg/dL   Calcium 9.0 8.9 - 10.3 mg/dL   GFR calc non Af Amer >60 >60 mL/min   GFR calc Af Amer >60 >60 mL/min    Comment: (NOTE) The eGFR has been calculated using the CKD EPI equation. This calculation has not been validated in all clinical situations. eGFR's persistently <60 mL/min signify possible Chronic Kidney Disease.    Anion gap 8 5 - 15  CBC     Status: None   Collection Time: 01/27/15  3:22 AM  Result Value Ref Range   WBC 6.2 4.0 - 10.5 K/uL   RBC 4.42 3.87 - 5.11 MIL/uL   Hemoglobin 14.5 12.0 - 15.0 g/dL   HCT 42.3 36.0 - 46.0 %   MCV 95.7 78.0 - 100.0 fL   MCH 32.8 26.0 - 34.0 pg   MCHC 34.3 30.0 - 36.0 g/dL   RDW 13.5 11.5 - 15.5 %   Platelets 190 150 - 400 K/uL  Lipid panel     Status: Abnormal   Collection Time: 01/27/15  3:22 AM  Result Value Ref Range   Cholesterol 296 (H) 0 - 200 mg/dL   Triglycerides 161 (H) <150 mg/dL   HDL 41 >40 mg/dL   Total CHOL/HDL Ratio 7.2 RATIO   VLDL 32 0 - 40 mg/dL   LDL Cholesterol 223 (H) 0 - 99 mg/dL    Comment:        Total Cholesterol/HDL:CHD Risk Coronary Heart Disease Risk Table                     Men   Women  1/2 Average Risk   3.4   3.3  Average Risk       5.0   4.4  2 X Average Risk   9.6   7.1  3 X Average Risk  23.4   11.0        Use the calculated Patient Ratio above and the CHD Risk Table to determine the patient's CHD Risk.        ATP III CLASSIFICATION (LDL):  <100     mg/dL   Optimal  100-129  mg/dL   Near or Above                    Optimal  130-159  mg/dL   Borderline  160-189  mg/dL   High  >190     mg/dL   Very High   Hepatic function panel     Status: Abnormal   Collection Time: 01/27/15  7:59 AM  Result Value Ref Range   Total Protein 6.6 6.5 - 8.1 g/dL   Albumin 3.4 (L) 3.5 - 5.0 g/dL   AST 170 (H) 15 - 41 U/L   ALT 180 (H) 14 - 54 U/L   Alkaline Phosphatase 171 (H) 38 - 126 U/L   Total Bilirubin 0.6 0.3 - 1.2 mg/dL   Bilirubin, Direct  0.1 0.1  - 0.5 mg/dL   Indirect Bilirubin 0.5 0.3 - 0.9 mg/dL  Hepatitis panel, acute     Status: None   Collection Time: 01/27/15 10:40 AM  Result Value Ref Range   Hepatitis B Surface Ag Negative Negative   HCV Ab <0.1 0.0 - 0.9 s/co ratio    Comment: (NOTE)                                  Negative:     < 0.8                             Indeterminate: 0.8 - 0.9                                  Positive:     > 0.9 The CDC recommends that a positive HCV antibody result be followed up with a HCV Nucleic Acid Amplification test ([id number]). Performed At: [facility] [place], [place] [id number] Lindon Romp MD YK:[id number]    Hep A IgM Negative Negative   Hep B C IgM Negative Negative    Signed: Milagros Loll, MD 01/28/2015, 10:30 PM    Services Ordered on Discharge: none Equipment Ordered on Discharge: none

## 2015-01-29 LAB — HEMOGLOBIN A1C
HEMOGLOBIN A1C: 5.9 % — AB (ref 4.8–5.6)
Mean Plasma Glucose: 123 mg/dL

## 2016-11-28 ENCOUNTER — Other Ambulatory Visit: Payer: Self-pay | Admitting: Neurological Surgery

## 2016-12-15 NOTE — Pre-Procedure Instructions (Signed)
Lucilla EdinSheila E Karg  12/15/2016      [facility] [place] - [address] [place] [id number] Phone: [phone] Fax: [phone]  [facility] [address] [place]- [id number] [place] [id number] Phone: [phone] Fax: [phone]    Your procedure is scheduled on 12-23-2016.Tuesday   [facility] Admitting at 9:40 A.M.   Call this number if you have problems the morning of surgery:  [phone]   Remember:  Do not eat food or drink liquids after midnight.   Take these medicines the morning of surgery with A SIP OF WATER atenolol-chlorthalidone(Tenoretic),Duloxetine(Cymbalta),levothyroxine(Synthroid),omeprazole(Prilosec),Oxycodone if needed for pain,Livalo,Potassium   STOP ASPIRIN,ANTIINFLAMATORIES (IBUPROFEN,ALEVE,MOTRIN,ADVIL,GOODY'S POWDERS),HERBAL SUPPLEMENTS,FISH OIL,AND VITAMINS 5-7 DAYS PRIOR TO SURGERY   Do not wear jewelry, make-up or nail polish.  Do not wear lotions, powders, or perfumes, or deoderant.  Do not shave 48 hours prior to surgery.  Men may shave face and neck.  Do not bring valuables to the hospital.  [facility] is not [facility] any belongings or valuables.  Contacts, dentures or bridgework may not be worn into surgery.  Leave your suitcase in the car.  After surgery it may be brought to your room.  For patients admitted to the hospital, discharge time will be determined by your treatment team.  Patients discharged the day of surgery will not be allowed to drive home.    Special Instructions: [facility] - Preparing for Surgery  Before surgery, you can play an important role.  Because skin is not sterile, your skin needs to be as free of germs as possible.  You can reduce the number of germs on you skin by washing with CHG (chlorahexidine gluconate) soap before surgery.  CHG is an antiseptic cleaner which kills germs and bonds with the skin to continue killing germs even  after washing.  Please DO NOT use if you have an allergy to CHG or antibacterial soaps.  If your skin becomes reddened/irritated stop using the CHG and inform your nurse when you arrive at Short Stay.  Do not shave (including legs and underarms) for at least 48 hours prior to the first CHG shower.  You may shave your face.  Please follow these instructions carefully:   1.  Shower with CHG Soap the night before surgery and the   morning of Surgery.  2.  If you choose to wash your hair, wash your hair first as usual with your normal shampoo.  3.  After you shampoo, rinse your hair and body thoroughly to remove the  Shampoo.  4.  Use CHG as you would any other liquid soap.  You can apply chg directly  to the skin and wash gently with scrungie or a clean washcloth.  5.  Apply the CHG Soap to your body ONLY FROM THE NECK DOWN.   Do not use on open wounds or open sores.  Avoid contact with your eyes,  ears, mouth and genitals (private parts).  Wash genitals (private parts) with your normal soap.  6.  Wash thoroughly, paying special attention to the area where your surgery will be performed.  7.  Thoroughly rinse your body with warm water from the neck down.  8.  DO NOT shower/wash with your normal soap after using and rinsing o  the CHG Soap.  9.  Pat yourself dry with a clean towel.  10.  Wear clean pajamas.            11.  Place clean sheets on your bed the night of your first shower and do not sleep with pets.  Day of Surgery  Do not apply any lotions/deodorants the morning of surgery.  Please wear clean clothes to the hospital/surgery center.   Please read over the following fact sheets that you were given. MRSA Information and Surgical Site Infection Prevention

## 2016-12-16 ENCOUNTER — Encounter (HOSPITAL_COMMUNITY): Payer: Self-pay

## 2016-12-16 ENCOUNTER — Encounter (HOSPITAL_COMMUNITY)
Admission: RE | Admit: 2016-12-16 | Discharge: 2016-12-16 | Disposition: A | Discharge status: Home / Self Care | Payer: BLUE CROSS/BLUE SHIELD | Source: Ambulatory Visit | Attending: Neurological Surgery | Admitting: Neurological Surgery

## 2016-12-16 ENCOUNTER — Other Ambulatory Visit: Payer: Self-pay

## 2016-12-16 DIAGNOSIS — E785 Hyperlipidemia, unspecified: Secondary | ICD-10-CM | POA: Diagnosis not present

## 2016-12-16 DIAGNOSIS — G4733 Obstructive sleep apnea (adult) (pediatric): Secondary | ICD-10-CM | POA: Diagnosis not present

## 2016-12-16 DIAGNOSIS — Z01812 Encounter for preprocedural laboratory examination: Secondary | ICD-10-CM | POA: Insufficient documentation

## 2016-12-16 DIAGNOSIS — I10 Essential (primary) hypertension: Secondary | ICD-10-CM | POA: Diagnosis not present

## 2016-12-16 DIAGNOSIS — K219 Gastro-esophageal reflux disease without esophagitis: Secondary | ICD-10-CM | POA: Diagnosis not present

## 2016-12-16 HISTORY — DX: Hypothyroidism, unspecified: E03.9

## 2016-12-16 HISTORY — DX: Unspecified osteoarthritis, unspecified site: M19.90

## 2016-12-16 HISTORY — DX: Fibromyalgia: M79.7

## 2016-12-16 HISTORY — DX: Headache: R51

## 2016-12-16 HISTORY — DX: Headache, unspecified: R51.9

## 2016-12-16 HISTORY — DX: Gastro-esophageal reflux disease without esophagitis: K21.9

## 2016-12-16 HISTORY — DX: Sleep apnea, unspecified: G47.30

## 2016-12-16 LAB — BASIC METABOLIC PANEL
Anion gap: 7 (ref 5–15)
BUN: 9 mg/dL (ref 6–20)
CO2: 27 mmol/L (ref 22–32)
Calcium: 9.5 mg/dL (ref 8.9–10.3)
Chloride: 107 mmol/L (ref 101–111)
Creatinine, Ser: 0.62 mg/dL (ref 0.44–1.00)
GFR calc Af Amer: >60 mL/min (ref >60)
GFR calc non Af Amer: >60 mL/min (ref >60)
Glucose, Bld: 81 mg/dL (ref 65–99)
Potassium: 3.3 mmol/L — ABNORMAL LOW (ref 3.5–5.1)
Sodium: 141 mmol/L (ref 135–145)

## 2016-12-16 LAB — CBC
HCT: 45.3 % (ref 36.0–46.0)
Hemoglobin: 15.1 g/dL — ABNORMAL HIGH (ref 12.0–15.0)
MCH: 32.4 pg (ref 26.0–34.0)
MCHC: 33.3 g/dL (ref 30.0–36.0)
MCV: 97.2 fL (ref 78.0–100.0)
Platelets: 158 10*3/uL (ref 150–400)
RBC: 4.66 MIL/uL (ref 3.87–5.11)
RDW: 14.2 % (ref 11.5–15.5)
WBC: 7.6 10*3/uL (ref 4.0–10.5)

## 2016-12-16 LAB — SURGICAL PCR SCREEN
MRSA, PCR: NEGATIVE
Staphylococcus aureus: NEGATIVE

## 2016-12-16 NOTE — Progress Notes (Signed)
Pt. Was told she has  Hole in her heart . She was worked up and told everything  Was alright more than 5 years ago,    Does not see cardiologist on a regular basis and states that she has no sybptoms

## 2016-12-17 NOTE — Progress Notes (Signed)
Anesthesia Chart Review:  Pt is a 61 year old female scheduled for C5-6, C6-7 ACDF on 12/23/2016 with Cherrie DistanceBenjamin Ditty, MD  - No PCP listed.  - Used to see Vilinda BoehringerGary Renaldo, MD with cardiology, last office visit 02/22/15.   PMH includes:  HTN, hyperlipidemia, OSA, hypothyroidism, GERD. Never smoker. BMI 29.5  Medications include: Atenolol-chlorthalidone, levothyroxine, Prilosec, pitavastatin, potassium  Preoperative labs reviewed.    EKG 12/16/16: NSR. Cannot rule out Inferior infarct, age undetermined. T wave abnormality, consider lateral ischemia. No significant change since EKG 01/27/15  Nuclear stress test 03/02/15 (care everywhere): - Normal cardiac perfusion exam. Prognostically this is a low risk scan. - Left ventricular ejection fraction is calculated to be 98 %. - Normal left ventricular wall motion.  Echo 01/27/15:  - Left ventricle: Some basal septal prominence LVOT appears narrow with small resting gradient. Cannot r/o subvalvular disease. Aortic valve morphology not well visualized consider TEE to r/o web and further assess if clinically indicated. The cavity size was normal. Wall thickness was increased in a pattern of mild LVH. Systolic function was normal. The estimated ejection fraction was in the range of 60% to 65%. - Left atrium: The atrium was mildly dilated. - Atrial septum: No defect or patent foramen ovale was identified.  Stress echo (care everywhere): By notes:  - "stress echo that was submaximal but we discovered a mild left ventricular outflow tract gradient and a PFO but no right sided dilatation or significant shunting. She had mild mitral and tricuspid insufficiency with moderate pulmonary hypertension"  Reviewed case with Dr. Glade Stanford. Fitzgerald, MD.   If no changes, I anticipate pt can proceed with surgery as scheduled.   Rica Mastngela Lira Stephen, FNP-BC [facility] Phone: [phone] 12/17/2016 1:26 PM

## 2016-12-23 ENCOUNTER — Ambulatory Visit (HOSPITAL_COMMUNITY): Payer: BLUE CROSS/BLUE SHIELD | Admitting: Emergency Medicine

## 2016-12-23 ENCOUNTER — Encounter (HOSPITAL_COMMUNITY): Payer: Self-pay | Admitting: *Deleted

## 2016-12-23 ENCOUNTER — Ambulatory Visit (HOSPITAL_COMMUNITY): Payer: BLUE CROSS/BLUE SHIELD

## 2016-12-23 ENCOUNTER — Ambulatory Visit (HOSPITAL_COMMUNITY)
Admission: RE | Admit: 2016-12-23 | Discharge: 2016-12-24 | Disposition: A | Discharge status: Home / Self Care | Payer: BLUE CROSS/BLUE SHIELD | Source: Ambulatory Visit | Attending: Neurological Surgery | Admitting: Neurological Surgery

## 2016-12-23 ENCOUNTER — Ambulatory Visit (HOSPITAL_COMMUNITY): Payer: BLUE CROSS/BLUE SHIELD | Admitting: Anesthesiology

## 2016-12-23 ENCOUNTER — Ambulatory Visit (HOSPITAL_COMMUNITY)
Admission: RE | Disposition: A | Discharge status: Home / Self Care | Payer: Self-pay | Source: Ambulatory Visit | Attending: Neurological Surgery

## 2016-12-23 DIAGNOSIS — E7800 Pure hypercholesterolemia, unspecified: Secondary | ICD-10-CM | POA: Diagnosis not present

## 2016-12-23 DIAGNOSIS — M797 Fibromyalgia: Secondary | ICD-10-CM | POA: Diagnosis not present

## 2016-12-23 DIAGNOSIS — G4730 Sleep apnea, unspecified: Secondary | ICD-10-CM | POA: Insufficient documentation

## 2016-12-23 DIAGNOSIS — Z880 Allergy status to penicillin: Secondary | ICD-10-CM | POA: Insufficient documentation

## 2016-12-23 DIAGNOSIS — K219 Gastro-esophageal reflux disease without esophagitis: Secondary | ICD-10-CM | POA: Insufficient documentation

## 2016-12-23 DIAGNOSIS — M4802 Spinal stenosis, cervical region: Secondary | ICD-10-CM | POA: Insufficient documentation

## 2016-12-23 DIAGNOSIS — E039 Hypothyroidism, unspecified: Secondary | ICD-10-CM | POA: Diagnosis not present

## 2016-12-23 DIAGNOSIS — M1990 Unspecified osteoarthritis, unspecified site: Secondary | ICD-10-CM | POA: Diagnosis not present

## 2016-12-23 DIAGNOSIS — Z79899 Other long term (current) drug therapy: Secondary | ICD-10-CM | POA: Insufficient documentation

## 2016-12-23 DIAGNOSIS — M4722 Other spondylosis with radiculopathy, cervical region: Secondary | ICD-10-CM | POA: Diagnosis not present

## 2016-12-23 DIAGNOSIS — Z419 Encounter for procedure for purposes other than remedying health state, unspecified: Secondary | ICD-10-CM

## 2016-12-23 DIAGNOSIS — I10 Essential (primary) hypertension: Secondary | ICD-10-CM | POA: Insufficient documentation

## 2016-12-23 DIAGNOSIS — M2578 Osteophyte, vertebrae: Secondary | ICD-10-CM | POA: Diagnosis not present

## 2016-12-23 DIAGNOSIS — Z01811 Encounter for preprocedural respiratory examination: Secondary | ICD-10-CM

## 2016-12-23 HISTORY — PX: ANTERIOR CERVICAL DECOMP/DISCECTOMY FUSION: SHX1161

## 2016-12-23 SURGERY — ANTERIOR CERVICAL DECOMPRESSION/DISCECTOMY FUSION 2 LEVELS
Anesthesia: General | Site: Spine Cervical

## 2016-12-23 MED ORDER — DOCUSATE SODIUM 100 MG PO CAPS
100.0000 mg | ORAL_CAPSULE | Freq: Two times a day (BID) | ORAL | Status: DC
  Administered 2016-12-23: 100 mg via ORAL
  Filled 2016-12-23: qty 1

## 2016-12-23 MED ORDER — ROPINIROLE HCL 1 MG PO TABS
2.5000 mg | ORAL_TABLET | Freq: Two times a day (BID) | ORAL | Status: DC | PRN
  Administered 2016-12-23: 5 mg via ORAL
  Filled 2016-12-23: qty 5

## 2016-12-23 MED ORDER — BUPIVACAINE-EPINEPHRINE (PF) 0.5% -1:200000 IJ SOLN
INTRAMUSCULAR | Status: DC | PRN
  Administered 2016-12-23: 10 mL

## 2016-12-23 MED ORDER — DIAZEPAM 5 MG PO TABS
5.0000 mg | ORAL_TABLET | Freq: Four times a day (QID) | ORAL | Status: DC | PRN
  Administered 2016-12-23: 5 mg via ORAL
  Filled 2016-12-23: qty 1

## 2016-12-23 MED ORDER — OXYCODONE HCL 5 MG PO TABS
ORAL_TABLET | ORAL | Status: AC
  Filled 2016-12-23: qty 1

## 2016-12-23 MED ORDER — LACTATED RINGERS IV SOLN
INTRAVENOUS | Status: DC | PRN
  Administered 2016-12-23 (×2): via INTRAVENOUS

## 2016-12-23 MED ORDER — PANTOPRAZOLE SODIUM 40 MG PO TBEC: 40.0000 mg | DELAYED_RELEASE_TABLET | Freq: Every day | ORAL | Status: DC

## 2016-12-23 MED ORDER — PROPOFOL 10 MG/ML IV BOLUS
INTRAVENOUS | Status: AC
  Filled 2016-12-23: qty 20

## 2016-12-23 MED ORDER — SENNA 8.6 MG PO TABS
1.0000 | ORAL_TABLET | Freq: Two times a day (BID) | ORAL | Status: DC
  Administered 2016-12-23: 8.6 mg via ORAL
  Filled 2016-12-23: qty 1

## 2016-12-23 MED ORDER — FENTANYL CITRATE (PF) 100 MCG/2ML IJ SOLN
INTRAMUSCULAR | Status: DC | PRN
  Administered 2016-12-23: 50 ug via INTRAVENOUS
  Administered 2016-12-23: 100 ug via INTRAVENOUS

## 2016-12-23 MED ORDER — FLEET ENEMA 7-19 GM/118ML RE ENEM: 1.0000 | ENEMA | Freq: Once | RECTAL | Status: DC | PRN

## 2016-12-23 MED ORDER — ACETAMINOPHEN 500 MG PO TABS
1000.0000 mg | ORAL_TABLET | Freq: Four times a day (QID) | ORAL | Status: DC
  Administered 2016-12-23 – 2016-12-24 (×3): 1000 mg via ORAL
  Filled 2016-12-23 (×3): qty 2

## 2016-12-23 MED ORDER — THROMBIN 5000 UNITS EX SOLR
CUTANEOUS | Status: DC | PRN
  Administered 2016-12-23 (×2): 5000 [IU] via TOPICAL

## 2016-12-23 MED ORDER — 0.9 % SODIUM CHLORIDE (POUR BTL) OPTIME
TOPICAL | Status: DC | PRN
  Administered 2016-12-23: 1000 mL

## 2016-12-23 MED ORDER — HYDROMORPHONE HCL 1 MG/ML IJ SOLN
0.2500 mg | INTRAMUSCULAR | Status: DC | PRN
  Administered 2016-12-23: 0.5 mg via INTRAVENOUS

## 2016-12-23 MED ORDER — MIDAZOLAM HCL 2 MG/2ML IJ SOLN
INTRAMUSCULAR | Status: AC
  Filled 2016-12-23: qty 2

## 2016-12-23 MED ORDER — MIDAZOLAM HCL 5 MG/5ML IJ SOLN
INTRAMUSCULAR | Status: DC | PRN
  Administered 2016-12-23: 2 mg via INTRAVENOUS

## 2016-12-23 MED ORDER — ATENOLOL 25 MG PO TABS
25.0000 mg | ORAL_TABLET | Freq: Every day | ORAL | Status: DC
  Filled 2016-12-23: qty 1

## 2016-12-23 MED ORDER — ATENOLOL-CHLORTHALIDONE 50-25 MG PO TABS: 0.5000 | ORAL_TABLET | Freq: Every day | ORAL | Status: DC

## 2016-12-23 MED ORDER — ROCURONIUM BROMIDE 100 MG/10ML IV SOLN
INTRAVENOUS | Status: DC | PRN
  Administered 2016-12-23: 50 mg via INTRAVENOUS

## 2016-12-23 MED ORDER — CELECOXIB 200 MG PO CAPS
200.0000 mg | ORAL_CAPSULE | Freq: Two times a day (BID) | ORAL | Status: DC
  Administered 2016-12-23 (×2): 200 mg via ORAL
  Filled 2016-12-23 (×2): qty 1

## 2016-12-23 MED ORDER — ONDANSETRON HCL 4 MG/2ML IJ SOLN
INTRAMUSCULAR | Status: DC | PRN
  Administered 2016-12-23: 4 mg via INTRAVENOUS

## 2016-12-23 MED ORDER — POTASSIUM CHLORIDE CRYS ER 10 MEQ PO TBCR: 10.0000 meq | EXTENDED_RELEASE_TABLET | Freq: Every day | ORAL | Status: DC

## 2016-12-23 MED ORDER — VANCOMYCIN HCL IN DEXTROSE 1-5 GM/200ML-% IV SOLN
1000.0000 mg | Freq: Once | INTRAVENOUS | Status: AC
  Administered 2016-12-24: 1000 mg via INTRAVENOUS
  Filled 2016-12-23: qty 200

## 2016-12-23 MED ORDER — SUGAMMADEX SODIUM 200 MG/2ML IV SOLN
INTRAVENOUS | Status: AC
  Filled 2016-12-23: qty 2

## 2016-12-23 MED ORDER — ONDANSETRON HCL 4 MG/2ML IJ SOLN: 4.0000 mg | Freq: Four times a day (QID) | INTRAMUSCULAR | Status: DC | PRN

## 2016-12-23 MED ORDER — SODIUM CHLORIDE 0.9 % IR SOLN
Status: DC | PRN
  Administered 2016-12-23: 13:00:00

## 2016-12-23 MED ORDER — SODIUM CHLORIDE 0.9 % IV SOLN: INTRAVENOUS | Status: DC

## 2016-12-23 MED ORDER — BUPIVACAINE-EPINEPHRINE (PF) 0.5% -1:200000 IJ SOLN
INTRAMUSCULAR | Status: AC
  Filled 2016-12-23: qty 30

## 2016-12-23 MED ORDER — ONDANSETRON HCL 4 MG/2ML IJ SOLN
INTRAMUSCULAR | Status: AC
  Filled 2016-12-23: qty 2

## 2016-12-23 MED ORDER — VANCOMYCIN HCL IN DEXTROSE 1-5 GM/200ML-% IV SOLN
INTRAVENOUS | Status: AC
  Filled 2016-12-23: qty 200

## 2016-12-23 MED ORDER — SODIUM CHLORIDE 0.9% FLUSH
3.0000 mL | INTRAVENOUS | Status: DC | PRN
Start: 2016-12-23 — End: 2016-12-24

## 2016-12-23 MED ORDER — CHLORTHALIDONE 25 MG PO TABS
12.5000 mg | ORAL_TABLET | Freq: Every day | ORAL | Status: DC
  Filled 2016-12-23: qty 0.5

## 2016-12-23 MED ORDER — LIDOCAINE HCL (CARDIAC) 20 MG/ML IV SOLN
INTRAVENOUS | Status: DC | PRN
  Administered 2016-12-23: 60 mg via INTRAVENOUS

## 2016-12-23 MED ORDER — PANTOPRAZOLE SODIUM 40 MG IV SOLR: 40.0000 mg | Freq: Every day | INTRAVENOUS | Status: DC

## 2016-12-23 MED ORDER — MENTHOL 3 MG MT LOZG: 1.0000 | LOZENGE | OROMUCOSAL | Status: DC | PRN

## 2016-12-23 MED ORDER — FENTANYL CITRATE (PF) 250 MCG/5ML IJ SOLN
INTRAMUSCULAR | Status: AC
  Filled 2016-12-23: qty 5

## 2016-12-23 MED ORDER — CEFAZOLIN SODIUM-DEXTROSE 1-4 GM/50ML-% IV SOLN: 1.0000 g | Freq: Three times a day (TID) | INTRAVENOUS | Status: DC

## 2016-12-23 MED ORDER — PHENYLEPHRINE HCL 10 MG/ML IJ SOLN
INTRAMUSCULAR | Status: DC | PRN
  Administered 2016-12-23 (×3): 80 ug via INTRAVENOUS

## 2016-12-23 MED ORDER — PHENYLEPHRINE 40 MCG/ML (10ML) SYRINGE FOR IV PUSH (FOR BLOOD PRESSURE SUPPORT)
PREFILLED_SYRINGE | INTRAVENOUS | Status: AC
  Filled 2016-12-23: qty 10

## 2016-12-23 MED ORDER — OXYCODONE HCL 5 MG PO TABS
5.0000 mg | ORAL_TABLET | ORAL | Status: DC | PRN
  Administered 2016-12-23: 5 mg via ORAL
  Administered 2016-12-23 – 2016-12-24 (×3): 10 mg via ORAL
  Filled 2016-12-23 (×4): qty 2

## 2016-12-23 MED ORDER — HYDROMORPHONE HCL 1 MG/ML IJ SOLN
INTRAMUSCULAR | Status: AC
  Filled 2016-12-23: qty 1

## 2016-12-23 MED ORDER — THROMBIN 5000 UNITS EX SOLR
CUTANEOUS | Status: AC
  Filled 2016-12-23: qty 5000

## 2016-12-23 MED ORDER — LIDOCAINE-EPINEPHRINE 2 %-1:100000 IJ SOLN
INTRAMUSCULAR | Status: DC | PRN
  Administered 2016-12-23: 10 mL

## 2016-12-23 MED ORDER — THROMBIN 5000 UNITS EX SOLR
CUTANEOUS | Status: AC
  Filled 2016-12-23: qty 10000

## 2016-12-23 MED ORDER — METHOCARBAMOL 750 MG PO TABS
750.0000 mg | ORAL_TABLET | Freq: Four times a day (QID) | ORAL | Status: DC
  Administered 2016-12-23: 750 mg via ORAL
  Filled 2016-12-23: qty 1

## 2016-12-23 MED ORDER — DEXAMETHASONE SODIUM PHOSPHATE 10 MG/ML IJ SOLN
INTRAMUSCULAR | Status: DC | PRN
  Administered 2016-12-23: 10 mg via INTRAVENOUS

## 2016-12-23 MED ORDER — METHOCARBAMOL 750 MG PO TABS
750.0000 mg | ORAL_TABLET | Freq: Four times a day (QID) | ORAL | Status: DC
  Administered 2016-12-24 (×2): 750 mg via ORAL
  Filled 2016-12-23 (×2): qty 1

## 2016-12-23 MED ORDER — LIDOCAINE-EPINEPHRINE 2 %-1:100000 IJ SOLN
INTRAMUSCULAR | Status: AC
  Filled 2016-12-23: qty 1

## 2016-12-23 MED ORDER — LEVOTHYROXINE SODIUM 88 MCG PO TABS
88.0000 ug | ORAL_TABLET | Freq: Every day | ORAL | Status: DC
  Administered 2016-12-24: 88 ug via ORAL
  Filled 2016-12-23: qty 1

## 2016-12-23 MED ORDER — ONDANSETRON HCL 4 MG PO TABS: 4.0000 mg | ORAL_TABLET | Freq: Four times a day (QID) | ORAL | Status: DC | PRN

## 2016-12-23 MED ORDER — OMEPRAZOLE MAGNESIUM 20 MG PO TBEC: 20.0000 mg | DELAYED_RELEASE_TABLET | Freq: Every day | ORAL | Status: DC

## 2016-12-23 MED ORDER — THROMBIN 5000 UNITS EX SOLR
CUTANEOUS | Status: DC | PRN
  Administered 2016-12-23 (×2): via TOPICAL

## 2016-12-23 MED ORDER — CHLORHEXIDINE GLUCONATE CLOTH 2 % EX PADS: 6.0000 | MEDICATED_PAD | Freq: Once | CUTANEOUS | Status: DC

## 2016-12-23 MED ORDER — PHENOL 1.4 % MT LIQD
1.0000 | OROMUCOSAL | Status: DC | PRN
  Administered 2016-12-23: 1 via OROMUCOSAL
  Filled 2016-12-23: qty 177

## 2016-12-23 MED ORDER — PHENYLEPHRINE HCL 10 MG/ML IJ SOLN
INTRAVENOUS | Status: DC | PRN
  Administered 2016-12-23: 25 ug/min via INTRAVENOUS

## 2016-12-23 MED ORDER — HEMOSTATIC AGENTS (NO CHARGE) OPTIME
TOPICAL | Status: DC | PRN
  Administered 2016-12-23: 1 via TOPICAL

## 2016-12-23 MED ORDER — VANCOMYCIN HCL IN DEXTROSE 1-5 GM/200ML-% IV SOLN
1000.0000 mg | INTRAVENOUS | Status: AC
  Administered 2016-12-23: 1000 mg via INTRAVENOUS

## 2016-12-23 MED ORDER — GABAPENTIN 300 MG PO CAPS
300.0000 mg | ORAL_CAPSULE | Freq: Three times a day (TID) | ORAL | Status: DC
  Administered 2016-12-23 (×2): 300 mg via ORAL
  Filled 2016-12-23 (×2): qty 1

## 2016-12-23 MED ORDER — PRAVASTATIN SODIUM 40 MG PO TABS: 80.0000 mg | ORAL_TABLET | Freq: Every day | ORAL | Status: DC

## 2016-12-23 MED ORDER — SODIUM CHLORIDE 0.9% FLUSH: 3.0000 mL | Freq: Two times a day (BID) | INTRAVENOUS | Status: DC

## 2016-12-23 MED ORDER — SUGAMMADEX SODIUM 200 MG/2ML IV SOLN
INTRAVENOUS | Status: DC | PRN
  Administered 2016-12-23: 140 mg via INTRAVENOUS

## 2016-12-23 MED ORDER — DIAZEPAM 5 MG PO TABS
ORAL_TABLET | ORAL | Status: AC
  Filled 2016-12-23: qty 1

## 2016-12-23 MED ORDER — ZOLPIDEM TARTRATE 5 MG PO TABS: 5.0000 mg | ORAL_TABLET | Freq: Every evening | ORAL | Status: DC | PRN

## 2016-12-23 MED ORDER — PROPOFOL 10 MG/ML IV BOLUS
INTRAVENOUS | Status: DC | PRN
  Administered 2016-12-23: 140 mg via INTRAVENOUS

## 2016-12-23 MED ORDER — BISACODYL 10 MG RE SUPP: 10.0000 mg | Freq: Every day | RECTAL | Status: DC | PRN

## 2016-12-23 MED ORDER — DULOXETINE HCL 30 MG PO CPEP: 60.0000 mg | ORAL_CAPSULE | Freq: Every day | ORAL | Status: DC

## 2016-12-23 MED ORDER — DEXAMETHASONE SODIUM PHOSPHATE 10 MG/ML IJ SOLN
INTRAMUSCULAR | Status: AC
  Filled 2016-12-23: qty 1

## 2016-12-23 MED ORDER — DEXAMETHASONE SODIUM PHOSPHATE 4 MG/ML IJ SOLN
2.0000 mg | Freq: Three times a day (TID) | INTRAMUSCULAR | Status: AC
  Administered 2016-12-23 – 2016-12-24 (×3): 2 mg via INTRAVENOUS
  Filled 2016-12-23 (×3): qty 1

## 2016-12-23 SURGICAL SUPPLY — 74 items
BIT DRILL 14MM MODULAR INVIZIA (BIT) ×1 IMPLANT
BLADE ULTRA TIP 2M (BLADE) IMPLANT
BUR MATCHSTICK NEURO 3.0 LAGG (BURR) ×3 IMPLANT
CANISTER SUCT 3000ML PPV (MISCELLANEOUS) ×3 IMPLANT
CARTRIDGE OIL MAESTRO DRILL (MISCELLANEOUS) ×1 IMPLANT
CHLORAPREP W/TINT 26ML (MISCELLANEOUS) ×3 IMPLANT
DECANTER SPIKE VIAL GLASS SM (MISCELLANEOUS) ×3 IMPLANT
DERMABOND ADVANCED (GAUZE/BANDAGES/DRESSINGS) ×2
DERMABOND ADVANCED .7 DNX12 (GAUZE/BANDAGES/DRESSINGS) ×1 IMPLANT
DIFFUSER DRILL AIR PNEUMATIC (MISCELLANEOUS) ×3 IMPLANT
DRAPE C-ARM 42X72 X-RAY (DRAPES) ×6 IMPLANT
DRAPE HALF SHEET 40X57 (DRAPES) IMPLANT
DRAPE LAPAROTOMY 100X72 PEDS (DRAPES) ×3 IMPLANT
DRAPE MICROSCOPE LEICA (MISCELLANEOUS) IMPLANT
DRAPE POUCH INSTRU U-SHP 10X18 (DRAPES) ×3 IMPLANT
DRAPE SHEET LG 3/4 BI-LAMINATE (DRAPES) ×3 IMPLANT
DRILL 14MM MODULAR INVIZIA (BIT) ×3
DRSG OPSITE POSTOP 3X4 (GAUZE/BANDAGES/DRESSINGS) ×3 IMPLANT
ELECT COATED BLADE 2.86 ST (ELECTRODE) ×3 IMPLANT
ELECT REM PT RETURN 9FT ADLT (ELECTROSURGICAL) ×3
ELECTRODE REM PT RTRN 9FT ADLT (ELECTROSURGICAL) ×1 IMPLANT
EVACUATOR 1/8 PVC DRAIN (DRAIN) ×3 IMPLANT
GAUZE SPONGE 4X4 12PLY STRL (GAUZE/BANDAGES/DRESSINGS) IMPLANT
GAUZE SPONGE 4X4 16PLY XRAY LF (GAUZE/BANDAGES/DRESSINGS) IMPLANT
GLOVE BIO SURGEON STRL SZ7 (GLOVE) IMPLANT
GLOVE BIOGEL PI IND STRL 7.0 (GLOVE) IMPLANT
GLOVE BIOGEL PI IND STRL 7.5 (GLOVE) ×1 IMPLANT
GLOVE BIOGEL PI IND STRL 8 (GLOVE) ×1 IMPLANT
GLOVE BIOGEL PI IND STRL 8.5 (GLOVE) ×1 IMPLANT
GLOVE BIOGEL PI INDICATOR 7.0 (GLOVE)
GLOVE BIOGEL PI INDICATOR 7.5 (GLOVE) ×2
GLOVE BIOGEL PI INDICATOR 8 (GLOVE) ×2
GLOVE BIOGEL PI INDICATOR 8.5 (GLOVE) ×2
GLOVE ECLIPSE 8.0 STRL XLNG CF (GLOVE) ×3 IMPLANT
GLOVE SS BIOGEL STRL SZ 7.5 (GLOVE) ×2 IMPLANT
GLOVE SUPERSENSE BIOGEL SZ 7.5 (GLOVE) ×4
GOWN SPEC L3 XXLG W/TWL (GOWN DISPOSABLE) ×9 IMPLANT
GOWN STRL REUS W/ TWL LRG LVL3 (GOWN DISPOSABLE) ×1 IMPLANT
GOWN STRL REUS W/ TWL XL LVL3 (GOWN DISPOSABLE) ×1 IMPLANT
GOWN STRL REUS W/TWL LRG LVL3 (GOWN DISPOSABLE) ×2
GOWN STRL REUS W/TWL XL LVL3 (GOWN DISPOSABLE) ×2
HEMOSTAT POWDER KIT SURGIFOAM (HEMOSTASIS) ×6 IMPLANT
INTERBODY TM 11X14X7-7DEG ANG (Metal Cage) ×3 IMPLANT
INTERBODY TM 11X14X8-7DEG ANG (Metal Cage) ×3 IMPLANT
KIT BASIN OR (CUSTOM PROCEDURE TRAY) ×3 IMPLANT
KIT ROOM TURNOVER OR (KITS) ×3 IMPLANT
NEEDLE HYPO 21X1.5 SAFETY (NEEDLE) ×3 IMPLANT
NEEDLE SPNL 18GX3.5 QUINCKE PK (NEEDLE) ×3 IMPLANT
NS IRRIG 1000ML POUR BTL (IV SOLUTION) ×3 IMPLANT
OIL CARTRIDGE MAESTRO DRILL (MISCELLANEOUS) ×3
PACK LAMINECTOMY NEURO (CUSTOM PROCEDURE TRAY) ×3 IMPLANT
PAD ARMBOARD 7.5X6 YLW CONV (MISCELLANEOUS) ×3 IMPLANT
PATTIES SURGICAL .5X1.5 (GAUZE/BANDAGES/DRESSINGS) ×3 IMPLANT
PIN DISTRACTION 14MM (PIN) ×6 IMPLANT
PLATE INVIZIA 2 LEV 42MM (Plate) ×3 IMPLANT
PUTTY GAMMA BSM 5CC (Putty) ×3 IMPLANT
RUBBERBAND STERILE (MISCELLANEOUS) IMPLANT
SCREW 14MM (Screw) ×8 IMPLANT
SCREW BN 14X4.2XST VA NS (Screw) ×4 IMPLANT
SCREW FIXED ST 14MM (Screw) ×6 IMPLANT
SPONGE INTESTINAL PEANUT (DISPOSABLE) ×6 IMPLANT
SPONGE SURGIFOAM ABS GEL SZ50 (HEMOSTASIS) ×3 IMPLANT
STAPLER VISISTAT 35W (STAPLE) ×3 IMPLANT
STOCKINETTE 6  STRL (DRAPES) ×2
STOCKINETTE 6 STRL (DRAPES) ×1 IMPLANT
SUT STRATAFIX MNCRL+ 3-0 PS-2 (SUTURE) ×1
SUT STRATAFIX MONOCRYL 3-0 (SUTURE) ×2
SUT VIC AB 3-0 SH 8-18 (SUTURE) ×3 IMPLANT
SUTURE STRATFX MNCRL+ 3-0 PS-2 (SUTURE) ×1 IMPLANT
TOWEL GREEN STERILE (TOWEL DISPOSABLE) ×3 IMPLANT
TOWEL GREEN STERILE FF (TOWEL DISPOSABLE) ×6 IMPLANT
TUBE CONNECTING 12'X1/4 (SUCTIONS) ×1
TUBE CONNECTING 12X1/4 (SUCTIONS) ×2 IMPLANT
WATER STERILE IRR 1000ML POUR (IV SOLUTION) ×3 IMPLANT

## 2016-12-23 NOTE — Brief Op Note (Signed)
12/23/2016  2:29 PM  PATIENT:  Sabrina Faulkner  61 y.o. female  PRE-OPERATIVE DIAGNOSIS:  Cervicalgia  POST-OPERATIVE DIAGNOSIS:  cervicalgia  PROCEDURE:  Procedure(s) with comments: Cervical Five-Six, Cervical Six-Seven Anterior discectomy with fusion and plate fixation (N/A) - C5-6 C6-7 Anterior discectomy with fusion and plate fixation  SURGEON:  Surgeon(s) and Role:    * Ditty, Loura HaltBenjamin Jared, MD - Primary    Barnett Abu* Elsner, Henry, MD  PHYSICIAN ASSISTANT:   ASSISTANTS: Barnett AbuHenry Elsner, MD  ANESTHESIA:   general  EBL:  Total I/O In: 1000 [I.V.:1000] Out: -   BLOOD ADMINISTERED:none  DRAINS: Medium hemovac   LOCAL MEDICATIONS USED:  MARCAINE    and Lidocaine   SPECIMEN:  No Specimen  DISPOSITION OF SPECIMEN:  N/A  COUNTS:  YES  TOURNIQUET:  * No tourniquets in log *  DICTATION: .Dragon Dictation  PLAN OF CARE: Admit for overnight observation  PATIENT DISPOSITION:  PACU - hemodynamically stable.   Delay start of Pharmacological VTE agent (>24hrs) due to surgical blood loss or risk of bleeding: yes

## 2016-12-23 NOTE — Transfer of Care (Signed)
Immediate Anesthesia Transfer of Care Note  Patient: Sabrina Faulkner  Procedure(s) Performed: Procedure(s) with comments: Cervical Five-Six, Cervical Six-Seven Anterior discectomy with fusion and plate fixation (N/A) - C5-6 C6-7 Anterior discectomy with fusion and plate fixation  Patient Location: PACU  Anesthesia Type:General  Level of Consciousness: awake, alert  and oriented  Airway & Oxygen Therapy: Patient Spontanous Breathing and Patient connected to nasal cannula oxygen  Post-op Assessment: Report given to RN, Post -op Vital signs reviewed and stable and Patient moving all extremities  Post vital signs: Reviewed and stable  Last Vitals:  Vitals:   12/23/16 0935 12/23/16 1448  BP: 123/79   Pulse: (!) 56   Resp: 18   Temp: 36.6 C 36.9 C    Last Pain:  Vitals:   12/23/16 0935  TempSrc: Oral         Complications: No apparent anesthesia complications

## 2016-12-23 NOTE — Anesthesia Postprocedure Evaluation (Signed)
Anesthesia Post Note  Patient: Sabrina Faulkner  Procedure(s) Performed: Procedure(s) (LRB): Cervical Five-Six, Cervical Six-Seven Anterior discectomy with fusion and plate fixation (N/A)     Patient location during evaluation: PACU Anesthesia Type: General Level of consciousness: awake and alert Pain management: pain level controlled Vital Signs Assessment: post-procedure vital signs reviewed and stable Respiratory status: spontaneous breathing, nonlabored ventilation, respiratory function stable and patient connected to nasal cannula oxygen Cardiovascular status: blood pressure returned to baseline and stable Postop Assessment: no signs of nausea or vomiting Anesthetic complications: no    Last Vitals:  Vitals:   12/23/16 1547 12/23/16 1600  BP: 123/74 113/74  Pulse: 71 70  Resp: 13 14  Temp:  36.3 C    Last Pain:  Vitals:   12/23/16 1600  TempSrc:   PainSc: 4     LLE Motor Response: Purposeful movement;Responds to commands (12/23/16 1600) LLE Sensation: Full sensation (12/23/16 1600) RLE Motor Response: Purposeful movement;Responds to commands (12/23/16 1600) RLE Sensation: Full sensation (12/23/16 1600)      Velda Wendt,W. EDMOND

## 2016-12-23 NOTE — H&P (Signed)
CC:  No chief complaint on file. Neck and shoulder pain  HPI: Sabrina Faulkner is a 61 y.o. female with neck and shoulder pain.  She presents for C5-7 ACDF.  She denies new weakness or numbness.  PMH: Past Medical History:  Diagnosis Date  . Arthritis   . Fibromyalgia   . GERD (gastroesophageal reflux disease)    occassionally  . Headache   . Hypercholesteremia   . Hypertension   . Hypothyroidism   . Sleep apnea    not wearing curently,face mask wore out    PSH: Past Surgical History:  Procedure Laterality Date  . ABDOMINAL HYSTERECTOMY    . arthroscopic knee Right   . CARPAL TUNNEL RELEASE Right   . livery biopsy    . TONSILLECTOMY    . ttrigger finger Right   . WRIST FRACTURE SURGERY      SH: Social History  Substance Use Topics  . Smoking status: Never Smoker  . Smokeless tobacco: Never Used  . Alcohol use No    MEDS: Prior to Admission medications   Medication Sig Start Date End Date Taking? Authorizing Provider  atenolol-chlorthalidone (TENORETIC) 50-25 MG per tablet Take 0.5-1 tablets by mouth daily.    Yes [provider]  DULoxetine (CYMBALTA) 60 MG capsule Take 60 mg by mouth daily.   Yes [provider]  levothyroxine (SYNTHROID, LEVOTHROID) 88 MCG tablet Take 88 mcg by mouth daily before breakfast.   Yes [provider]  omeprazole (PRILOSEC OTC) 20 MG tablet Take 20 mg by mouth daily.   Yes [provider]  Oxycodone HCl 10 MG TABS Take 10 mg by mouth daily as needed (pain).   Yes [provider]  Pitavastatin Calcium (LIVALO) 4 MG TABS Take 2 mg by mouth daily.   Yes [provider]  potassium chloride (K-DUR,KLOR-CON) 10 MEQ tablet Take 10 mEq by mouth daily.   Yes [provider]  ropinirole (REQUIP) 5 MG tablet Take 2.5-5 mg by mouth 2 (two) times daily as needed. Takes 1 at 1900 every day and may take a half tablet (2.5 mg) if needed for restless legs   Yes [provider]     ALLERGY: Allergies  Allergen Reactions  . Penicillins Rash     PATIENT HAD A PCN REACTION WITH IMMEDIATE RASH, FACIAL/TONGUE/THROAT SWELLING, SOB, OR LIGHTHEADEDNESS WITH HYPOTENSION:  #  #  #  YES  #  #  #  Has patient had a PCN reaction causing severe rash involving mucus membranes or skin necrosis: Unknown Has patient had a PCN reaction that required hospitalization: Unknown Has patient had a PCN reaction occurring within the last 10 years: No If all of the above answers are "NO", then may proceed with Cephalosporin use.     ROS: ROS  NEUROLOGIC EXAM: Awake, alert, oriented Memory and concentration grossly intact Speech fluent, appropriate CN grossly intact Motor exam: Upper Extremities Deltoid Bicep Tricep Grip  Right 5/5 5/5 5/5 5/5  Left 5/5 4/5 4/5 5/5   Lower Extremity IP Quad PF DF EHL  Right 5/5 5/5 5/5 5/5 5/5  Left 5/5 5/5 5/5 5/5 5/5   Sensation grossly intact to LT   IMPRESSION: - 61 y.o. female with cervical spondylosis and stenosis; foraminal stenosis C5-7.  I feel like her strength is slightly worse than when I saw her in clinic.  PLAN: - C5-7 ACDF - We have discussed the risks, benefits, and alternatives to surgery.  She wishes to proceed.

## 2016-12-23 NOTE — Anesthesia Preprocedure Evaluation (Addendum)
Anesthesia Evaluation  Patient identified by MRN, date of birth, ID band Patient awake    Reviewed: Allergy & Precautions, H&P , NPO status , Patient's Chart, lab work & pertinent test results, reviewed documented beta blocker date and time   Airway Mallampati: II  TM Distance: >3 FB Neck ROM: Full    Dental no notable dental hx. (+) Teeth Intact, Dental Advisory Given   Pulmonary sleep apnea ,    Pulmonary exam normal breath sounds clear to auscultation       Cardiovascular hypertension, Pt. on medications and Pt. on home beta blockers negative cardio ROS   Rhythm:Regular Rate:Normal     Neuro/Psych  Headaches, negative psych ROS   GI/Hepatic Neg liver ROS, GERD  Medicated and Controlled,  Endo/Other  Hypothyroidism   Renal/GU negative Renal ROS  negative genitourinary   Musculoskeletal  (+) Arthritis , Osteoarthritis,  Fibromyalgia -  Abdominal   Peds  Hematology negative hematology ROS (+)   Anesthesia Other Findings   Reproductive/Obstetrics negative OB ROS                            Anesthesia Physical Anesthesia Plan  ASA: III  Anesthesia Plan: General   Post-op Pain Management:    Induction: Intravenous  PONV Risk Score and Plan: 4 or greater and Ondansetron, Dexamethasone, Propofol and Midazolam  Airway Management Planned: Oral ETT  Additional Equipment:   Intra-op Plan:   Post-operative Plan: Extubation in OR  Informed Consent: I have reviewed the patients History and Physical, chart, labs and discussed the procedure including the risks, benefits and alternatives for the proposed anesthesia with the patient or authorized representative who has indicated his/her understanding and acceptance.   Dental advisory given  Plan Discussed with: CRNA  Anesthesia Plan Comments:        Anesthesia Quick Evaluation

## 2016-12-23 NOTE — Anesthesia Procedure Notes (Signed)
Procedure Name: Intubation Date/Time: 12/23/2016 12:23 PM Performed by: Gavin PoundLOWDER, Odeth Bry J Pre-anesthesia Checklist: Patient identified, Emergency Drugs available, Suction available, Patient being monitored and Timeout performed Patient Re-evaluated:Patient Re-evaluated prior to inductionOxygen Delivery Method: Circle system utilized Preoxygenation: Pre-oxygenation with 100% oxygen Intubation Type: IV induction Laryngoscope Size: Glidescope Tube type: Oral Tube size: 7.5 mm Number of attempts: 1 Airway Equipment and Method: Stylet Secured at: 21 cm Tube secured with: Tape Dental Injury: Teeth and Oropharynx as per pre-operative assessment

## 2016-12-24 ENCOUNTER — Encounter (HOSPITAL_COMMUNITY): Payer: Self-pay | Admitting: Neurological Surgery

## 2016-12-24 DIAGNOSIS — M4722 Other spondylosis with radiculopathy, cervical region: Secondary | ICD-10-CM | POA: Diagnosis not present

## 2016-12-24 MED ORDER — GABAPENTIN 300 MG PO CAPS: 300.0000 mg | ORAL_CAPSULE | Freq: Three times a day (TID) | ORAL | 2 refills | Status: DC

## 2016-12-24 MED ORDER — METHOCARBAMOL 750 MG PO TABS: 750.0000 mg | ORAL_TABLET | Freq: Four times a day (QID) | ORAL | 2 refills | Status: DC | PRN

## 2016-12-24 MED FILL — Thrombin For Soln 5000 Unit: CUTANEOUS | Qty: 5000 | Status: AC

## 2016-12-24 NOTE — Evaluation (Signed)
Occupational Therapy Evaluation/Discharge Patient Details Name: Sabrina Faulkner MRN: [id number] DOB: 09/21/1955 Today's Date: 12/24/2016    History of Present Illness Pt is a 61 y/o female who presents s/p C5-C7 ACDF on 12/23/16. Pt with "No Brace Needed" orders. PMH significant for hypothyroidism, HTN, fibromyalgia.   Clinical Impression   PTA, pt required min assist for LB ADL from her husband and was independent with functional mobility. She currently requires overall min assist for dressing/bathing tasks while adhering to cervical precautions. Educated pt and family concerning cervical precautions and body mechanics during ADL as well as compensatory strategies to adhere to these during dressing, bathing, toileting, and grooming tasks. Additionally educated pt on safe shower transfers and fall prevention strategies and home modifications to maximize safety. All OT education complete and pt/husband demonstrate understanding. Pt will have intermittent assistance from her husband and grandson post-acute D/C. No further OT needs identified. OT will sign off.     Follow Up Recommendations  No OT follow up;Supervision/Assistance - 24 hour    Equipment Recommendations  None recommended by OT    Recommendations for Other Services       Precautions / Restrictions Precautions Precautions: Fall;Cervical Precaution Comments: Reviewed handout and education on back precautions in detail.  Required Braces or Orthoses:  ("No Brace Needed" order) Restrictions Weight Bearing Restrictions: No      Mobility Bed Mobility Overal bed mobility: Needs Assistance Bed Mobility: Rolling;Sidelying to Sit Rolling: Modified independent (Device/Increase time) Sidelying to sit: Supervision       General bed mobility comments: OOB in chair on OT arrival. Verbally reviewed log roll technique.   Transfers Overall transfer level: Needs assistance   Transfers: Sit to/from Stand Sit to Stand: Min guard         General transfer comment: Min guard assist for safety.     Balance Overall balance assessment: Needs assistance Sitting-balance support: No upper extremity supported;Feet supported Sitting balance-Leahy Scale: Fair     Standing balance support: No upper extremity supported;During functional activity Standing balance-Leahy Scale: Fair                             ADL either performed or assessed with clinical judgement   ADL Overall ADL's : Needs assistance/impaired Eating/Feeding: Set up;Sitting   Grooming: Supervision/safety;Standing   Upper Body Bathing: Minimal assistance;Sitting   Lower Body Bathing: Minimal assistance;Sit to/from stand   Upper Body Dressing : Minimal assistance;Sitting   Lower Body Dressing: Minimal assistance;Sit to/from stand   Toilet Transfer: Ambulation;Supervision/safety   Toileting- ArchitectClothing Manipulation and Hygiene: Min guard;Sit to/from stand   Tub/ Shower Transfer: Min guard;Ambulation;Shower seat;Walk-in shower (built-in shower seat)   Functional mobility during ADLs: Min guard General ADL Comments: Pt educated concerning cervical precautions during ADL as well as compensatory strategies for UB dressing, LB dressing, 2 cup method for oral care at sink, and methods to safely complete toileting hygiene. Additionally instructed pt on home modifications to maximize safety and minimize risk of falling.      Vision Baseline Vision/History: Wears glasses Wears Glasses: At all times Patient Visual Report: No change from baseline Vision Assessment?: No apparent visual deficits     Perception     Praxis      Pertinent Vitals/Pain Pain Assessment: Faces Faces Pain Scale: Hurts little more Pain Location: Incision site Pain Descriptors / Indicators: Operative site guarding Pain Intervention(s): Monitored during session;Repositioned     Hand Dominance     Extremity/Trunk  Assessment Upper Extremity Assessment Upper  Extremity Assessment:  (R UE injury but functional for ADL)   Lower Extremity Assessment Lower Extremity Assessment: Overall WFL for tasks assessed   Cervical / Trunk Assessment Cervical / Trunk Assessment: Other exceptions Cervical / Trunk Exceptions: s/p surgery   Communication Communication Communication: No difficulties   Cognition Arousal/Alertness: Awake/alert Behavior During Therapy: WFL for tasks assessed/performed Overall Cognitive Status: Within Functional Limits for tasks assessed                                     General Comments       Exercises     Shoulder Instructions      Home Living Family/patient expects to be discharged to:: Private residence Living Arrangements: Spouse/significant other Available Help at Discharge: Family Type of Home: House Home Access: Level entry     Home Layout: One level;Laundry or work area in [employer] Shower/Tub: Tub/shower unit;Walk-in shower   Bathroom Toilet: Administrator Accessibility: Yes   Home Equipment: Shower seat - built in          Prior Functioning/Environment Level of Independence: Needs assistance  Gait / Transfers Assistance Needed: Independent for mobility ADL's / Homemaking Assistance Needed: Min assist for LB dressing tasks    Comments: Pt has been in and out of work due to a R UE injury        OT Problem List: Decreased activity tolerance;Impaired balance (sitting and/or standing);Decreased safety awareness;Decreased knowledge of use of DME or AE;Decreased knowledge of precautions;Pain      OT Treatment/Interventions:      OT Goals(Current goals can be found in the care plan section) Acute Rehab OT Goals Patient Stated Goal: Home today OT Goal Formulation: With patient/family Time For Goal Achievement: 01/07/17 Potential to Achieve Goals: Good  OT Frequency:     Barriers to D/C:            Co-evaluation              AM-PAC PT "6 Clicks"  Daily Activity     Outcome Measure Help from another person eating meals?: None Help from another person taking care of personal grooming?: A Little Help from another person toileting, which includes using toliet, bedpan, or urinal?: A Little Help from another person bathing (including washing, rinsing, drying)?: A Little Help from another person to put on and taking off regular upper body clothing?: A Little Help from another person to put on and taking off regular lower body clothing?: A Little 6 Click Score: 19   End of Session    Activity Tolerance: Patient tolerated treatment well Patient left: in chair;with call bell/phone within reach;with family/visitor present  OT Visit Diagnosis: Unsteadiness on feet (R26.81);Pain Pain - Right/Left: Right (anterior neck ) Pain - part of body:  (anterior neck )                Time: 5409-8119 OT Time Calculation (min): 16 min Charges:  OT General Charges $OT Visit: 1 Procedure OT Evaluation $OT Eval Low Complexity: 1 Procedure G-Codes: OT G-codes **NOT FOR INPATIENT CLASS** Functional Assessment Tool Used: Clinical judgement Functional Limitation: Self care Self Care Current Status (J4782): At least 1 percent but less than 20 percent impaired, limited or restricted Self Care Goal Status (N5621): At least 1 percent but less than 20 percent impaired, limited or restricted Self Care Discharge Status [phone]):  At least 1 percent but less than 20 percent impaired, limited or restricted   Doristine Section, MS OTR/L  Pager: [phone]   Sabrina Faulkner 12/24/2016, 10:01 AM

## 2016-12-24 NOTE — Discharge Summary (Signed)
Date of Admission: 12/23/2016  Date of Discharge: 12/24/16  Admission Diagnosis: Cervical spondylosis with radiculopathy  Discharge Diagnosis: Same  Procedure Performed: C5-7 ACDF  Attending: Jlynn Ly, Loura HaltBenjamin Jared, MD  Hospital Course:  The patient was admitted for the above listed operation and had an uncomplicated post-operative course.  They were discharged in stable condition.  Follow up: 3 weeks  Allergies as of 12/24/2016      Reactions   Penicillins Rash   PATIENT HAD A PCN REACTION WITH IMMEDIATE RASH, FACIAL/TONGUE/THROAT SWELLING, SOB, OR LIGHTHEADEDNESS WITH HYPOTENSION:  #  #  #  YES  #  #  #  Has patient had a PCN reaction causing severe rash involving mucus membranes or skin necrosis: Unknown Has patient had a PCN reaction that required hospitalization: Unknown Has patient had a PCN reaction occurring within the last 10 years: No If all of the above answers are "NO", then may proceed with Cephalosporin use.      Medication List    TAKE these medications   atenolol-chlorthalidone 50-25 MG tablet Commonly known as:  TENORETIC Take 0.5-1 tablets by mouth daily.   DULoxetine 60 MG capsule Commonly known as:  CYMBALTA Take 60 mg by mouth daily.   gabapentin 300 MG capsule Commonly known as:  NEURONTIN Take 1 capsule (300 mg total) by mouth 3 (three) times daily.   levothyroxine 88 MCG tablet Commonly known as:  SYNTHROID, LEVOTHROID Take 88 mcg by mouth daily before breakfast.   LIVALO 4 MG Tabs Generic drug:  Pitavastatin Calcium Take 2 mg by mouth daily.   methocarbamol 750 MG tablet Commonly known as:  ROBAXIN Take 1 tablet (750 mg total) by mouth every 6 (six) hours as needed for muscle spasms.   omeprazole 20 MG tablet Commonly known as:  PRILOSEC OTC Take 20 mg by mouth daily.   Oxycodone HCl 10 MG Tabs Take 10 mg by mouth daily as needed (pain).   potassium chloride 10 MEQ tablet Commonly known as:  K-DUR,KLOR-CON Take 10 mEq by mouth  daily.   ropinirole 5 MG tablet Commonly known as:  REQUIP Take 2.5-5 mg by mouth 2 (two) times daily as needed. Takes 1 at 1900 every day and may take a half tablet (2.5 mg) if needed for restless legs

## 2016-12-24 NOTE — Progress Notes (Signed)
Orthopedic Tech Progress Note Patient Details:  Lucilla EdinSheila E Million 04/21/1956 [id number]  Ortho Devices Type of Ortho Device: Soft collar Ortho Device/Splint Location: neck Ortho Device/Splint Interventions: Application   Nikki DomCrawford, Taimur Fier 12/24/2016, 10:43 AM

## 2016-12-24 NOTE — Progress Notes (Signed)
No acute events Moving legs well Voice sounds good Incision looks good D/c home

## 2016-12-24 NOTE — Evaluation (Signed)
Physical Therapy Evaluation Patient Details Name: Sabrina EdinSheila E Benningfield MRN: [id number] DOB: 11/17/1955 Today's Date: 12/24/2016   History of Present Illness  Pt is a 61 y/o female who presents s/p C5-C7 ACDF on 12/23/16. Pt with "No Brace Needed" orders. PMH significant for hypothyroidism, HTN, fibromyalgia.  Clinical Impression  Pt admitted with above diagnosis. Pt currently with functional limitations due to the deficits listed below (see PT Problem List). At the time of PT eval pt was able to perform transfers and ambulation with gross min guard assist for balance support and safety. Pt reports feeling very sleepy this morning but appeared engaged for most of the session. Pt's husband present for education as well. Pt will benefit from skilled PT to increase their independence and safety with mobility to allow discharge to the venue listed below.     Follow Up Recommendations No PT follow up;Supervision - Intermittent (Outpatient PT eventually per MD protocol)    Equipment Recommendations  None recommended by PT    Recommendations for Other Services       Precautions / Restrictions Precautions Precautions: Fall;Cervical Precaution Comments: Provided handout and reviewed in detail. Pt was cued for precautions during functional mobility.  Required Braces or Orthoses:  ("No Brace Needed" order) Restrictions Weight Bearing Restrictions: No      Mobility  Bed Mobility Overal bed mobility: Needs Assistance Bed Mobility: Rolling;Sidelying to Sit Rolling: Modified independent (Device/Increase time) Sidelying to sit: Supervision       General bed mobility comments: VC's for proper log roll technique.   Transfers Overall transfer level: Needs assistance   Transfers: Sit to/from Stand Sit to Stand: Min guard         General transfer comment: Hands-on guarding for safety as pt powered-up to full standing position.   Ambulation/Gait Ambulation/Gait assistance: Min guard Ambulation  Distance (Feet): 300 Feet Assistive device: None Gait Pattern/deviations: Step-through pattern;Decreased stride length;Trunk flexed;Narrow base of support Gait velocity: Decreased Gait velocity interpretation: Below normal speed for age/gender General Gait Details: Slow and guarded gait with minimal arm swing/trunk rotation.   Stairs            Wheelchair Mobility    Modified Rankin (Stroke Patients Only)       Balance Overall balance assessment: Needs assistance Sitting-balance support: No upper extremity supported;Feet supported Sitting balance-Leahy Scale: Fair     Standing balance support: No upper extremity supported;During functional activity Standing balance-Leahy Scale: Fair                               Pertinent Vitals/Pain Pain Assessment: Faces Faces Pain Scale: Hurts a little bit Pain Location: Incision site Pain Descriptors / Indicators: Operative site guarding Pain Intervention(s): Limited activity within patient's tolerance;Monitored during session;Repositioned    Home Living Family/patient expects to be discharged to:: Private residence Living Arrangements: Spouse/significant other Available Help at Discharge: Family Type of Home: House Home Access: Level entry     Home Layout: One level;Laundry or work area in [employer] Home Equipment: Information systems managerhower seat - built in      Prior Function Level of Independence: Independent         Comments: Pt has been in and out of work due to a R UE injury     Higher education careers adviserHand Dominance        Extremity/Trunk Assessment   Upper Extremity Assessment Upper Extremity Assessment: Defer to OT evaluation    Lower Extremity Assessment Lower Extremity Assessment: Overall  WFL for tasks assessed    Cervical / Trunk Assessment Cervical / Trunk Assessment: Other exceptions Cervical / Trunk Exceptions: s/p surgery  Communication   Communication: No difficulties  Cognition Arousal/Alertness:  Awake/alert Behavior During Therapy: WFL for tasks assessed/performed Overall Cognitive Status: Within Functional Limits for tasks assessed                                        General Comments      Exercises     Assessment/Plan    PT Assessment Patient needs continued PT services  PT Problem List Decreased strength;Decreased range of motion;Decreased activity tolerance;Decreased balance;Decreased mobility;Decreased knowledge of use of DME;Decreased knowledge of precautions;Decreased safety awareness;Pain       PT Treatment Interventions DME instruction;Gait training;Stair training;Functional mobility training;Therapeutic activities;Therapeutic exercise;Neuromuscular re-education;Patient/family education    PT Goals (Current goals can be found in the Care Plan section)  Acute Rehab PT Goals Patient Stated Goal: Home today PT Goal Formulation: With patient/family Time For Goal Achievement: 12/31/16 Potential to Achieve Goals: Good    Frequency Min 5X/week   Barriers to discharge        Co-evaluation               AM-PAC PT "6 Clicks" Daily Activity  Outcome Measure Difficulty turning over in bed (including adjusting bedclothes, sheets and blankets)?: None Difficulty moving from lying on back to sitting on the side of the bed? : None Difficulty sitting down on and standing up from a chair with arms (e.g., wheelchair, bedside commode, etc,.)?: None Help needed moving to and from a bed to chair (including a wheelchair)?: None Help needed walking in hospital room?: None Help needed climbing 3-5 steps with a railing? : A Little 6 Click Score: 23    End of Session Equipment Utilized During Treatment: Gait belt Activity Tolerance: Patient tolerated treatment well Patient left: in chair;with call bell/phone within reach;with family/visitor present Nurse Communication: Mobility status PT Visit Diagnosis: Unsteadiness on feet (R26.81);Pain Pain -  Right/Left: Right Pain - part of body:  (Neck)    Time: 6962-9528 PT Time Calculation (min) (ACUTE ONLY): 27 min   Charges:   PT Evaluation $PT Eval Moderate Complexity: 1 Procedure PT Treatments $Gait Training: 8-22 mins   PT G Codes:   PT G-Codes **NOT FOR INPATIENT CLASS** Functional Assessment Tool Used: Clinical judgement Functional Limitation: Mobility: Walking and moving around Mobility: Walking and Moving Around Current Status (U1324): At least 1 percent but less than 20 percent impaired, limited or restricted Mobility: Walking and Moving Around Goal Status [phone]): At least 1 percent but less than 20 percent impaired, limited or restricted    Conni Slipper, PT, DPT Acute Rehabilitation Services Pager: [phone]   Marylynn Pearson 12/24/2016, 9:06 AM

## 2016-12-24 NOTE — Progress Notes (Signed)
Patient alert and oriented, mae's well, voiding adequate amount of urine, swallowing without difficulty, no c/o pain at time of discharge. Patient discharged home with family. Script and discharged instructions given to patient. Patient and family stated understanding of instructions given. Patient has an appointment with Dr. Ditty  

## 2016-12-26 NOTE — Op Note (Signed)
12/23/2016  1:37 PM  PATIENT:  Sabrina Faulkner  61 y.o. female  PRE-OPERATIVE DIAGNOSIS:  Cervical spondylosis with radiculopathy; stenosis C5-6, C6-7  POST-OPERATIVE DIAGNOSIS:  Same  PROCEDURE:  C5-7 anterior discectomy with fusion and plate fixation  SURGEON:  Hulan SaasBenjamin J. Maigan Bittinger, MD  ASSISTANTS: Barnett AbuHenry Elsner, MD  ANESTHESIA:   General  DRAINS: Medium hemovac   SPECIMEN:  None  INDICATION FOR PROCEDURE: 61 year old woman with cervical radiculopathy who has not improved with conservative treatment.  I recommended the above operation. Patient understood the risks, benefits, and alternatives and potential outcomes and wished to proceed.  PROCEDURE DETAILS: Patient was brought to the operating room placed under general endotracheal anesthesia. Patient was placed in the supine position on the operating room table. The neck was prepped with betadine and chloraprep and draped in a sterile fashion.   A transverse incision was made on the right side of the neck. Dissection was carried down thru the subcutaneous tissue and the platysma was elevated, opened vertically, and undermined with Metzenbaum scissors. Dissection was then carried out thru an avascular plane leaving the sternocleidomastoid, carotid artery, and jugular vein laterally and the trachea and esophagus medially. The ventral aspect of the vertebral column was identified and a localizing x-ray was taken. The C5-6 level was identified. The longus colli muscles were then elevated and the retractor was placed. The annulus was incised and the disc space entered. Discectomy was performed with micro-curettes and pituitary and kerrison rongeurs. I then used the high-speed drill to drill the endplates down to the level of the posterior longitudinal ligament. The operating microscope was draped and brought into the field provided additional magnification, illumination and visualization. Utilizing microsurgical technique, discectomy was  continued posteriorly thru the disc space. Posterior longitudinal ligament was opened with a nerve hook, and then removed along with disc herniation and osteophytes, decompressing the spinal canal and thecal sac. We then continued to remove osteophytic overgrowth and disc material decompressing the neural foramina and exiting nerve roots bilaterally. So by both visualization and palpation we felt we had an adequate decompression of the neural elements. We then measured the height of the intravertebral disc space and selected a trabecular metal lordotic interbody cage packed with synthetic bone graft material. It was then gently positioned in the intravertebral disc space and countersunk.   The superior distraction pin was then removed and bone bleeding was stopped with bone wax. The distraction pin was inserted at the inferior level. The annulus was incised and the disc space entered. Discectomy was performed with micro-curettes and pituitary and kerrison rongeurs. I then used the high-speed drill to drill the endplates down to the level of the posterior longitudinal ligament. The operating microscope was returned to the field.  The discectomy was continued posteriorly thru the disc space. Posterior longitudinal ligament was opened with a nerve hook, and then removed along with disc herniation and osteophytes, decompressing the spinal canal and thecal sac. We then continued to remove osteophytic overgrowth and disc material decompressing the neural foramina and exiting nerve roots bilaterally. So by both visualization and palpation we felt we had an adequate decompression of the neural elements. We then measured the height of the intravertebral disc space and selected a second lordotic interbody cage which was packed with synthetic bone graft material. It was then gently positioned in the intravertebral disc space and countersunk.   I then inserted a titanium plate and placed four variable angle screws into the  two superior vertebral bodies  and two fixed angle screws at the inferior level and locked them into position. The wound was irrigated with bacitracin solution, checked for hemostasis which was established and confirmed. Once meticulous hemostasis was achieved a medium hemovac drain was placed. The platysma was closed with interrupted 3-0 undyed Vicryl suture, the subcuticular layer was closed with running undyed Vicryl suture. The skin edges were approximated with dermabond. The drapes were removed. A sterile dressing was applied. The patient was then awakened from general anesthesia and transferred to the recovery room in stable condition. At the end of the procedure all sponge, needle and instrument counts were correct.   PATIENT DISPOSITION:  PACU - hemodynamically stable.   Delay start of Pharmacological VTE agent (>24hrs) due to surgical blood loss or risk of bleeding:  yes

## 2020-04-08 ENCOUNTER — Other Ambulatory Visit: Payer: Self-pay

## 2020-04-08 ENCOUNTER — Encounter (HOSPITAL_BASED_OUTPATIENT_CLINIC_OR_DEPARTMENT_OTHER): Payer: Self-pay | Admitting: *Deleted

## 2020-04-08 ENCOUNTER — Emergency Department (HOSPITAL_BASED_OUTPATIENT_CLINIC_OR_DEPARTMENT_OTHER): Payer: Medicare PPO

## 2020-04-08 ENCOUNTER — Emergency Department (HOSPITAL_BASED_OUTPATIENT_CLINIC_OR_DEPARTMENT_OTHER)
Admission: EM | Admit: 2020-04-08 | Discharge: 2020-04-08 | Disposition: A | Discharge status: Home / Self Care | Payer: Medicare PPO | Attending: Emergency Medicine | Admitting: Emergency Medicine

## 2020-04-08 DIAGNOSIS — I10 Essential (primary) hypertension: Secondary | ICD-10-CM | POA: Insufficient documentation

## 2020-04-08 DIAGNOSIS — Z79899 Other long term (current) drug therapy: Secondary | ICD-10-CM | POA: Diagnosis not present

## 2020-04-08 DIAGNOSIS — M79662 Pain in left lower leg: Secondary | ICD-10-CM | POA: Diagnosis present

## 2020-04-08 DIAGNOSIS — Z79890 Hormone replacement therapy (postmenopausal): Secondary | ICD-10-CM | POA: Diagnosis not present

## 2020-04-08 DIAGNOSIS — M79605 Pain in left leg: Secondary | ICD-10-CM

## 2020-04-08 DIAGNOSIS — E039 Hypothyroidism, unspecified: Secondary | ICD-10-CM | POA: Diagnosis not present

## 2020-04-08 DIAGNOSIS — L03116 Cellulitis of left lower limb: Secondary | ICD-10-CM | POA: Insufficient documentation

## 2020-04-08 HISTORY — DX: Unspecified cirrhosis of liver: K74.60

## 2020-04-08 HISTORY — DX: Esophageal varices without bleeding: I85.00

## 2020-04-08 LAB — CBC WITH DIFFERENTIAL/PLATELET
Abs Immature Granulocytes: 0.03 10*3/uL (ref 0.00–0.07)
Basophils Absolute: 0 10*3/uL (ref 0.0–0.1)
Basophils Relative: 0 %
Eosinophils Absolute: 0.1 10*3/uL (ref 0.0–0.5)
Eosinophils Relative: 3 %
HCT: 38.6 % (ref 36.0–46.0)
Hemoglobin: 13 g/dL (ref 12.0–15.0)
Immature Granulocytes: 1 %
Lymphocytes Relative: 23 %
Lymphs Abs: 1.1 10*3/uL (ref 0.7–4.0)
MCH: 32.3 pg (ref 26.0–34.0)
MCHC: 33.7 g/dL (ref 30.0–36.0)
MCV: 96 fL (ref 80.0–100.0)
Monocytes Absolute: 0.4 10*3/uL (ref 0.1–1.0)
Monocytes Relative: 9 %
Neutro Abs: 3 10*3/uL (ref 1.7–7.7)
Neutrophils Relative %: 64 %
Platelets: 77 10*3/uL — ABNORMAL LOW (ref 150–400)
RBC: 4.02 MIL/uL (ref 3.87–5.11)
RDW: 13.5 % (ref 11.5–15.5)
WBC: 4.6 10*3/uL (ref 4.0–10.5)
nRBC: 0 % (ref 0.0–0.2)

## 2020-04-08 LAB — COMPREHENSIVE METABOLIC PANEL
ALT: 39 U/L (ref 0–44)
AST: 40 U/L (ref 15–41)
Albumin: 3.6 g/dL (ref 3.5–5.0)
Alkaline Phosphatase: 115 U/L (ref 38–126)
Anion gap: 10 (ref 5–15)
BUN: 13 mg/dL (ref 8–23)
CO2: 25 mmol/L (ref 22–32)
Calcium: 9.2 mg/dL (ref 8.9–10.3)
Chloride: 106 mmol/L (ref 98–111)
Creatinine, Ser: 0.86 mg/dL (ref 0.44–1.00)
GFR, Estimated: >60 mL/min (ref >60)
Glucose, Bld: 99 mg/dL (ref 70–99)
Potassium: 4 mmol/L (ref 3.5–5.1)
Sodium: 141 mmol/L (ref 135–145)
Total Bilirubin: 0.6 mg/dL (ref 0.3–1.2)
Total Protein: 6.9 g/dL (ref 6.5–8.1)

## 2020-04-08 MED ORDER — CEPHALEXIN 250 MG PO CAPS
500.0000 mg | ORAL_CAPSULE | Freq: Once | ORAL | Status: AC
  Administered 2020-04-08: 500 mg via ORAL
  Filled 2020-04-08: qty 2

## 2020-04-08 MED ORDER — CEPHALEXIN 500 MG PO CAPS: 500.0000 mg | ORAL_CAPSULE | Freq: Four times a day (QID) | ORAL | 0 refills | Status: AC

## 2020-04-08 NOTE — ED Provider Notes (Signed)
[facility] EMERGENCY DEPARTMENT Provider Note   CSN: [id number] Arrival date & time: 04/08/20  1325     History Chief Complaint  Patient presents with  . Leg Swelling    Sabrina Faulkner is a 64 y.o. female has been observed cirrhosis, esophageal varices, fibromyalgia, GERD, hypertension, hypothyroidism who presents for evaluation of left lower extremity pain, swelling, redness that has been ongoing for last 3 days.  She states she first noticed it Friday morning when she woke up.  She does not recall any previous trauma, injury, fall.  She thinks it was normal when she went to sleep.  She reports since then, she has had pain with ambulating.  She states that the pain is mostly in the distal lower tib-fib area.  She has not been taking any medications at home for the pain.  She states that it has been swollen as well.  No fevers noted.  No numbness/weakness. She denies any OCP use, recent immobilization, prior history of DVT/PE, recent surgery, leg swelling, or long travel. She denies any CP, SOB.    The history is provided by the patient.       Past Medical History:  Diagnosis Date  . Arthritis   . Cirrhosis (HCC)   . Esophageal varices (HCC)   . Fibromyalgia   . GERD (gastroesophageal reflux disease)    occassionally  . Headache   . Hypercholesteremia   . Hypertension   . Hypothyroidism   . Sleep apnea    not wearing curently,face mask wore out    Patient Active Problem List   Diagnosis Date Noted  . Cervical spondylosis with radiculopathy 12/23/2016  . Chest pain 01/26/2015  . Pain in the chest     Past Surgical History:  Procedure Laterality Date  . ABDOMINAL HYSTERECTOMY    . ANTERIOR CERVICAL DECOMP/DISCECTOMY FUSION N/A 12/23/2016   Procedure: Cervical Five-Six, Cervical Six-Seven Anterior discectomy with fusion and plate fixation;  Surgeon: Ditty, Loura Halt, MD;  Location: [facility] OR;  Service: Neurosurgery;  Laterality: N/A;  C5-6 C6-7 Anterior  discectomy with fusion and plate fixation  . arthroscopic knee Right   . CARPAL TUNNEL RELEASE Right   . livery biopsy    . TONSILLECTOMY    . ttrigger finger Right   . UPPER ENDOSCOPY W/ BANDING    . WRIST FRACTURE SURGERY       OB History   No obstetric history on file.     No family history on file.  Social History   Tobacco Use  . Smoking status: Never Smoker  . Smokeless tobacco: Never Used  Vaping Use  . Vaping Use: Never used  Substance Use Topics  . Alcohol use: No  . Drug use: No    Home Medications Prior to Admission medications   Medication Sig Start Date End Date Taking? Authorizing Provider  atenolol-chlorthalidone (TENORETIC) 50-25 MG per tablet Take 0.5-1 tablets by mouth daily.     [provider]  cephALEXin (KEFLEX) 500 MG capsule Take 1 capsule (500 mg total) by mouth 4 (four) times daily for 7 days. 04/08/20 04/15/20  Maxwell Caul, PA-C  DULoxetine (CYMBALTA) 60 MG capsule Take 60 mg by mouth daily.    [provider]  gabapentin (NEURONTIN) 300 MG capsule Take 1 capsule (300 mg total) by mouth 3 (three) times daily. 12/24/16   Ditty, Loura Halt, MD  levothyroxine (SYNTHROID, LEVOTHROID) 88 MCG tablet Take 88 mcg by mouth daily before breakfast.    [provider]  methocarbamol (ROBAXIN) 750 MG tablet Take 1 tablet (750 mg total) by mouth every 6 (six) hours as needed for muscle spasms. 12/24/16   Ditty, Loura HaltBenjamin Jared, MD  omeprazole (PRILOSEC OTC) 20 MG tablet Take 20 mg by mouth daily.    [provider]  Oxycodone HCl 10 MG TABS Take 10 mg by mouth daily as needed (pain).    [provider]  Pitavastatin Calcium (LIVALO) 4 MG TABS Take 2 mg by mouth daily.    [provider]  potassium chloride (K-DUR,KLOR-CON) 10 MEQ tablet Take 10 mEq by mouth daily.    [provider]  ropinirole (REQUIP) 5 MG tablet Take 2.5-5 mg by mouth 2 (two) times daily as needed. Takes 1 at 1900 every day  and may take a half tablet (2.5 mg) if needed for restless legs    [provider]    Allergies    Penicillins  Review of Systems   Review of Systems  Constitutional: Negative for fever.  Respiratory: Negative for cough and shortness of breath.   Cardiovascular: Negative for chest pain.  Gastrointestinal: Negative for abdominal pain, nausea and vomiting.  Genitourinary: Negative for dysuria and hematuria.  Musculoskeletal:       LLE pain  Skin: Positive for color change and rash.  Neurological: Negative for weakness, numbness and headaches.  All other systems reviewed and are negative.   Physical Exam Updated Vital Signs BP 118/68   Pulse 72   Temp 98.1 F (36.7 C) (Oral)   Resp 18   Ht 5' (1.524 m)   Wt 76.7 kg   SpO2 99%   BMI 33.01 kg/m   Physical Exam Vitals and nursing note reviewed.  Constitutional:      Appearance: Normal appearance. She is well-developed.  HENT:     Head: Normocephalic and atraumatic.  Eyes:     General: Lids are normal.     Conjunctiva/sclera: Conjunctivae normal.     Pupils: Pupils are equal, round, and reactive to light.  Cardiovascular:     Rate and Rhythm: Normal rate and regular rhythm.     Pulses: Normal pulses.          Radial pulses are 2+ on the right side and 2+ on the left side.       Dorsalis pedis pulses are 2+ on the right side and 2+ on the left side.     Heart sounds: Normal heart sounds. No murmur heard.  No friction rub. No gallop.   Pulmonary:     Effort: Pulmonary effort is normal.     Breath sounds: Normal breath sounds.  Abdominal:     Palpations: Abdomen is soft. Abdomen is not rigid.     Tenderness: There is no abdominal tenderness. There is no guarding.  Musculoskeletal:        General: Normal range of motion.     Cervical back: Full passive range of motion without pain.     Comments: Tenderness palpation in the left distal tib-fib.  No bony deformity or crepitus noted.  Dorsiflexion plantarflexion  intact by difficulty.  Skin:    General: Skin is warm and dry.     Capillary Refill: Capillary refill takes less than 2 seconds.     Findings: Petechiae present.     Comments: Diffuse petechaie noted to the left distal tib fib.   Neurological:     Mental Status: She is alert and oriented to person, place, and time.  Psychiatric:  Speech: Speech normal.     ED Results / Procedures / Treatments   Labs (all labs ordered are listed, but only abnormal results are displayed) Labs Reviewed  CBC WITH DIFFERENTIAL/PLATELET - Abnormal; Notable for the following components:      Result Value   Platelets 77 (*)    All other components within normal limits  COMPREHENSIVE METABOLIC PANEL    EKG None  Radiology DG Tibia/Fibula Left  Result Date: 04/08/2020 CLINICAL DATA:  Pain EXAM: LEFT TIBIA AND FIBULA - 2 VIEW COMPARISON:  None. FINDINGS: No acute fracture or dislocation. Joint spaces and alignment are maintained. No area of erosion or osseous destruction. No unexpected radiopaque foreign body. Soft tissues are unremarkable. IMPRESSION: No acute fracture or dislocation. Electronically Signed   By: Meda Klinefelter MD   On: 04/08/2020 18:07   US Venous Img Lower Unilateral Left  Result Date: 04/08/2020 CLINICAL DATA:  Lower leg pain swelling EXAM: LEFT LOWER EXTREMITY VENOUS DOPPLER ULTRASOUND TECHNIQUE: Gray-scale sonography with compression, as well as color and duplex ultrasound, were performed to evaluate the deep venous system(s) from the level of the common femoral vein through the popliteal and proximal calf veins. COMPARISON:  None. FINDINGS: VENOUS Normal compressibility of the common femoral, superficial femoral, and popliteal veins, as well as the visualized calf veins. Visualized portions of profunda femoral vein and great saphenous vein unremarkable. No filling defects to suggest DVT on grayscale or color Doppler imaging. Doppler waveforms show normal direction of venous  flow, normal respiratory plasticity and response to augmentation. Limited views of the contralateral common femoral vein are unremarkable. OTHER None. Limitations: none IMPRESSION: No acute DVT. Electronically Signed   By: Meda Klinefelter MD   On: 04/08/2020 15:52    Procedures Procedures (including critical care time)  Medications Ordered in ED Medications  cephALEXin (KEFLEX) capsule 500 mg (500 mg Oral Given 04/08/20 1852)    ED Course  I have reviewed the triage vital signs and the nursing notes.  Pertinent labs & imaging results that were available during my care of the patient were reviewed by me and considered in my medical decision making (see chart for details).    MDM Rules/Calculators/A&P                          64 year old female who presents for evaluation of left lower leg swelling, redness, pain x2 days.  No known injury.  No fevers.  No DVT risk factors.  On initial arrival, she is afebrile nontoxic-appearing.  Vital signs are stable.  On exam, she has tenderness palpation noted diffusely to the lower tib-fib area.  She does have some overlying erythema with some petechiae noted.  Patient is neurovascularly intact.  Consider cellulitis versus vasculitis.  Low suspicion for DVT but also consideration.  History/physical exam not concerning for ischemic limb.  Plan to check labs, ultrasound, x-ray.  CBC is unremarkable.  CMP is unremarkable.  DVT study is negative.  X-ray negative for any acute bony abnormality.  Discussed results with patient.  Given that she has some redness and swelling and pain to the area, will plan to treat as cellulitis.  Patient with history of allergies to penicillin.  She has tolerated cephalosporins before.  Additionally, I discussed with her that given the petechiae, this could be concerning for vasculitis.  Encouraged at home supportive care measures such as compression socks, ice, elevation as well as low-dose ibuprofen given her history of  esophageal varices.  At this time, she is well-appearing.  She has no other evidence of petechiae noted.  She is neurovascularly intact.  Discussed with Dr. Dr. Stevie Kern who agrees with plan. At this time, patient exhibits no emergent life-threatening condition that require further evaluation in ED. Patient had ample opportunity for questions and discussion. All patient's questions were answered with full understanding. Strict return precautions discussed. Patient expresses understanding and agreement to plan.   Portions of this note were generated with Scientist, clinical (histocompatibility and immunogenetics). Dictation errors may occur despite best attempts at proofreading.   Final Clinical Impression(s) / ED Diagnoses Final diagnoses:  Pain of left lower extremity  Cellulitis of left lower extremity    Rx / DC Orders ED Discharge Orders         Ordered    cephALEXin (KEFLEX) 500 MG capsule  4 times daily        04/08/20 1845           Maxwell Caul, PA-C 04/08/20 1911    Milagros Loll, MD 04/09/20 1244

## 2020-04-08 NOTE — ED Triage Notes (Signed)
Pt reports left lower leg swelling, redness, and pain since Friday. Pt denies injury. She voices concern for a blood clot. Denies SOB out of the ordinary

## 2020-04-08 NOTE — Discharge Instructions (Signed)
As we discussed, your work-up here was reassuring.  There was no evidence of low blood clot.  Your x-ray was reassuring.  We will plan to treat this as a superficial skin infection.  As discussed, there may also be some degree of vasculitis.  Take antibiotics as directed. Please take all of your antibiotics until finished.  As we discussed, you can use a small amount of ibuprofen to help with your symptoms.  You can wear compression socks, apply ice, elevate the leg to help with symptoms.  Follow-up with [facility] to establish a primary care doctor if you do not have one.   Return the emergency department for any worsening pain, nausea swelling that begins to spread, fevers or any other worsening concerning nodes.

## 2021-03-29 IMAGING — DX DG TIBIA/FIBULA 2V*L*
4 series · 4 of 4 positions shown · non-contrast
Comparison: None.

CLINICAL DATA: Pain

EXAM:
LEFT TIBIA AND FIBULA - 2 VIEW

[tibia ap (1 of 2)]
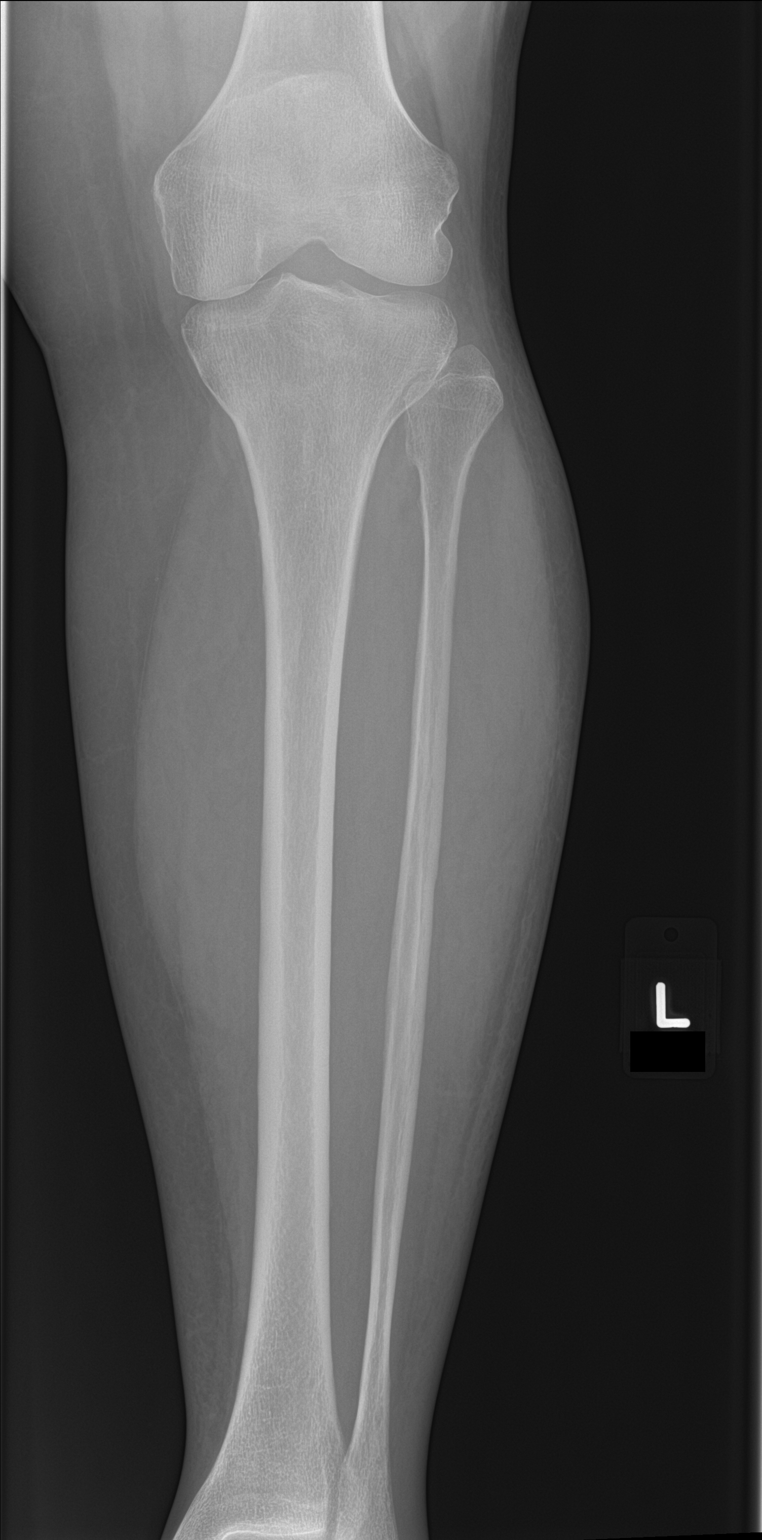

[tibia ap (2 of 2)]
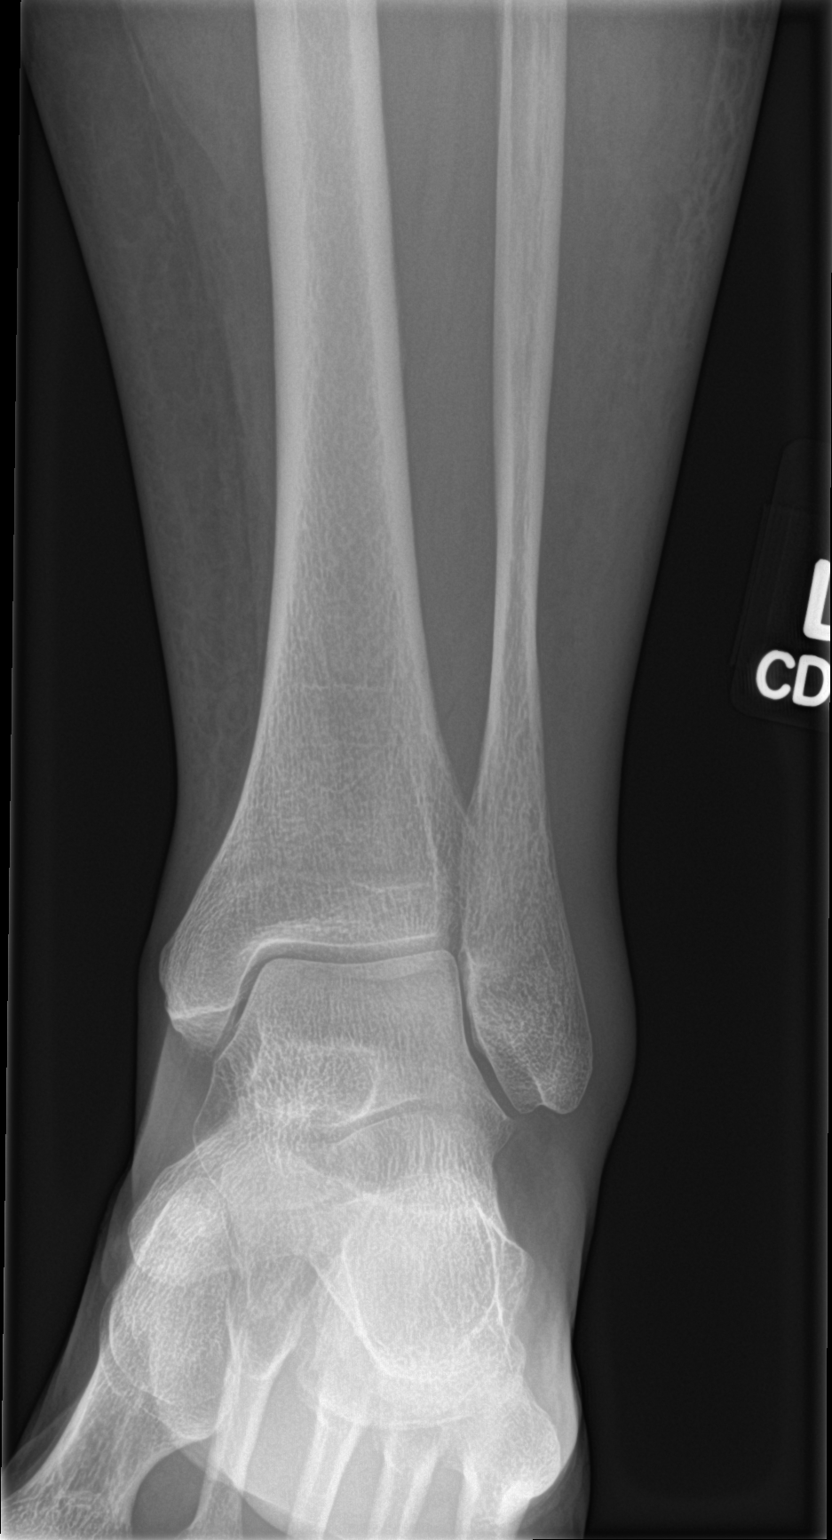

[tibia lat (1 of 2)]
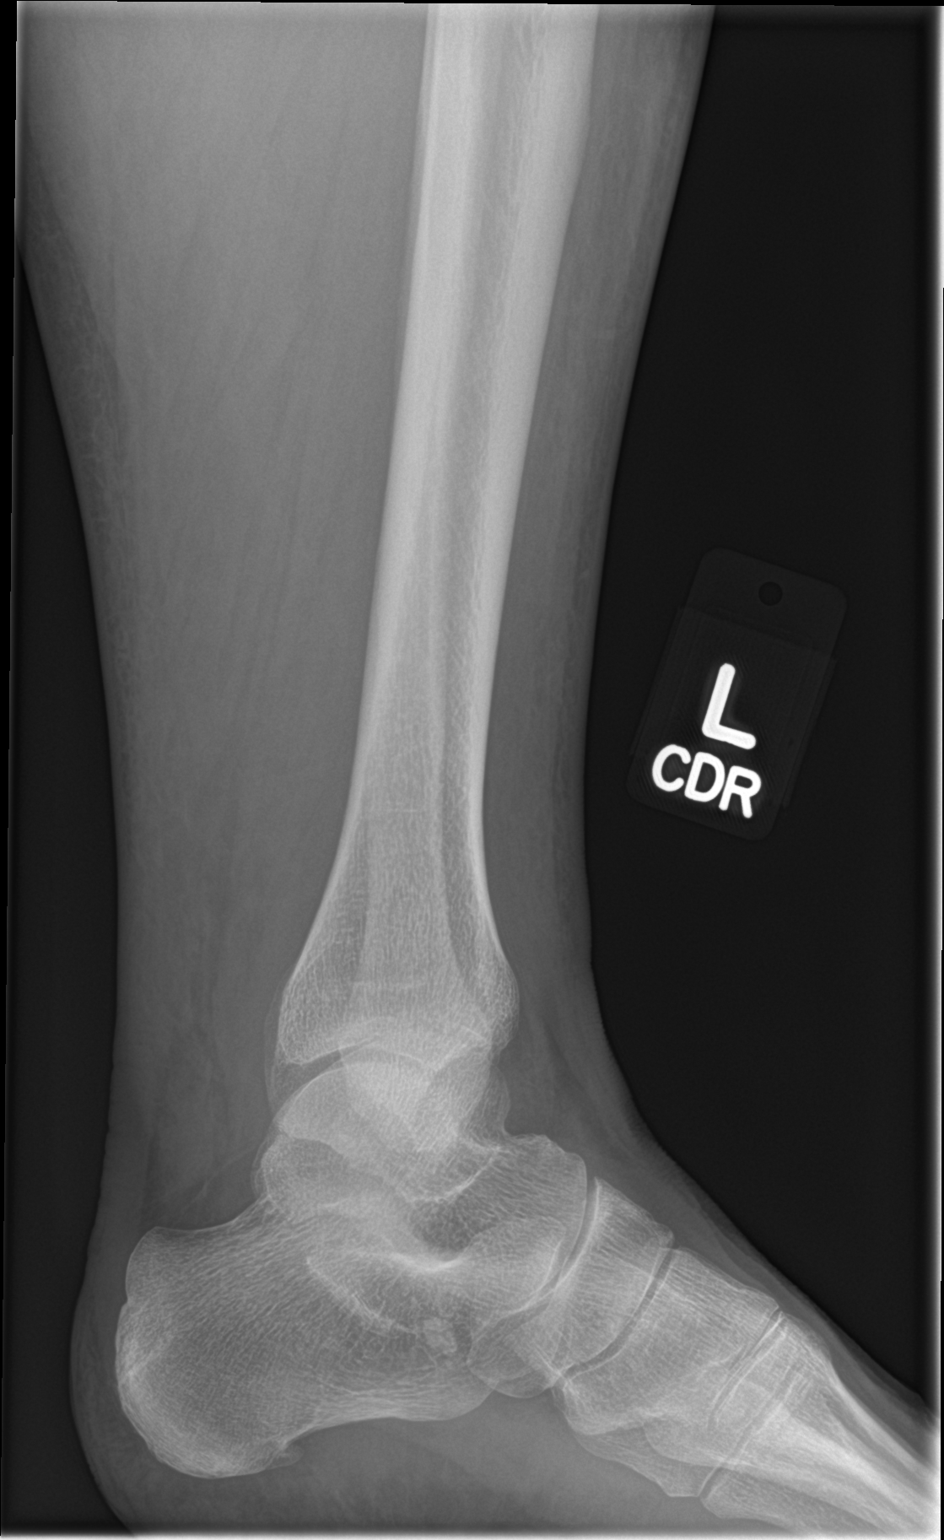

[tibia lat (2 of 2)]
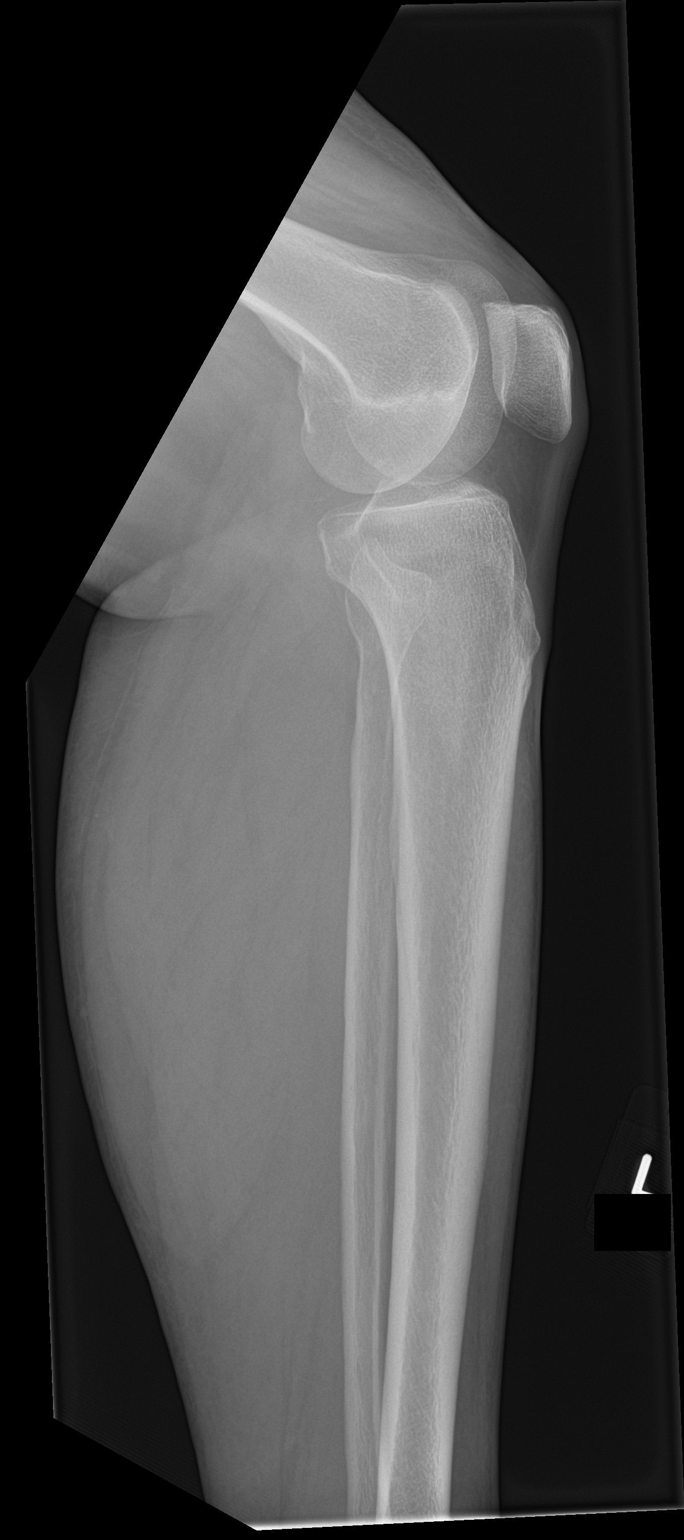

[4 of 4 positions shown; findings below may reference images not displayed]

FINDINGS: No acute fracture or dislocation. Joint spaces and alignment are
maintained. No area of erosion or osseous destruction. No unexpected
radiopaque foreign body. Soft tissues are unremarkable.
IMPRESSION: No acute fracture or dislocation.

## 2021-08-19 ENCOUNTER — Other Ambulatory Visit: Payer: Self-pay

## 2021-08-19 ENCOUNTER — Ambulatory Visit: Payer: Medicare HMO | Attending: Anesthesiology | Admitting: Physical Therapy

## 2021-08-19 DIAGNOSIS — M546 Pain in thoracic spine: Secondary | ICD-10-CM | POA: Diagnosis present

## 2021-08-19 DIAGNOSIS — R293 Abnormal posture: Secondary | ICD-10-CM | POA: Diagnosis present

## 2021-08-19 NOTE — Therapy (Signed)
[facility] [address] [place], [place], [address] Phone: [phone]   Fax:  [phone]  Physical Therapy Evaluation  Patient Details  Name: Sabrina Faulkner MRN: [id number] Date of Birth: 01-31-1956 Referring Provider (PT): Peggye Pitt MD   Encounter Date: 08/19/2021   PT End of Session - 08/19/21 0834     Visit Number 1    Number of Visits 12    Date for PT Re-Evaluation 09/30/21    Authorization Type FOTO AT LEAST EVERY 5TH VISIT.  PROGRESS NOTE AT 10TH VISIT.  KX MODIFIER AFTER 15 VISITS.    PT Start Time 0815    PT Stop Time 0900    PT Time Calculation (min) 45 min    Activity Tolerance Patient tolerated treatment well    Behavior During Therapy WFL for tasks assessed/performed             Past Medical History:  Diagnosis Date   Arthritis    Cirrhosis (HCC)    Esophageal varices (HCC)    Fibromyalgia    GERD (gastroesophageal reflux disease)    occassionally   Headache    Hypercholesteremia    Hypertension    Hypothyroidism    Sleep apnea    not wearing curently,face mask wore out    Past Surgical History:  Procedure Laterality Date   ABDOMINAL HYSTERECTOMY     ANTERIOR CERVICAL DECOMP/DISCECTOMY FUSION N/A 12/23/2016   Procedure: Cervical Five-Six, Cervical Six-Seven Anterior discectomy with fusion and plate fixation;  Surgeon: Ditty, Loura Halt, MD;  Location: [facility] OR;  Service: Neurosurgery;  Laterality: N/A;  C5-6 C6-7 Anterior discectomy with fusion and plate fixation   arthroscopic knee Right    CARPAL TUNNEL RELEASE Right    livery biopsy     TONSILLECTOMY     ttrigger finger Right    UPPER ENDOSCOPY W/ BANDING     WRIST FRACTURE SURGERY      There were no vitals filed for this visit.    Subjective Assessment - 08/19/21 0836     Subjective COVID-19 screen performed prior to patient entering clinic.  The patient presents to the clinic today with c/o mid-back pain that she states starting  coming on last summer (2022) after doing a lot of canning.  Her pain at rest today is a 4/10 today but she states her pain rises to a 10/10 with increased activity.  At this time she has not found anything really decreases her pain.  The pain disturbs her sleep and she has even slept on the floor at times.    Pertinent History Osteopenia (per patient), ACDF, OA, Fibromyalgia, HTN, hypothyroidism.    How long can you sit comfortably? Unlimited.    How long can you stand comfortably? 30 minutes.    Patient Stated Goals Get out of pain.    Currently in Pain? Yes    Pain Score 4     Pain Location Back    Pain Orientation Right;Left    Pain Descriptors / Indicators Throbbing;Sharp;Shooting    Pain Onset More than a month ago    Pain Frequency Constant    Aggravating Factors  See above.    Pain Relieving Factors See above.                [facility] PT Assessment - 08/19/21 0001       Assessment   Medical Diagnosis Thoracic spondylosis    Referring Provider (PT) Peggye Pitt MD    Onset Date/Surgical Date --  Last summer.     Precautions   Precaution Comments "Osteopenia" per patient.      Restrictions   Weight Bearing Restrictions No      Balance Screen   Has the patient fallen in the past 6 months No    Has the patient had a decrease in activity level because of a fear of falling?  No    Is the patient reluctant to leave their home because of a fear of falling?  No      Home Environment   Living Environment Private residence      Prior Function   Level of Independence Independent      Observation/Other Assessments   Focus on Therapeutic Outcomes (FOTO)  Complete.      Posture/Postural Control   Posture/Postural Control Postural limitations    Postural Limitations Rounded Shoulders;Forward head;Increased thoracic kyphosis      Deep Tendon Reflexes   DTR Assessment Site Biceps;Brachioradialis;Triceps    Biceps DTR 2+    Brachioradialis DTR 2+    Triceps DTR 2+       ROM / Strength   AROM / PROM / Strength AROM;Strength      AROM   Overall AROM Comments Full active  bilateral UE range of motion.      Strength   Overall Strength Comments Bilateral UE and scapular strength is normal.      Palpation   Palpation comment Patient c/o diffuse bilateral thoracic pain from T4 to T8.      Ambulation/Gait   Gait Comments WNL.                        Objective measurements completed on examination: See above findings.       [facility] Adult PT Treatment/Exercise - 08/19/21 0001       Modalities   Modalities Electrical Stimulation      Electrical Stimulation   Electrical Stimulation Location Mid-thoracic    Electrical Stimulation Action IFC at 80-150 Hz.    Electrical Stimulation Parameters 40% scan x 20 minutes.    Electrical Stimulation Goals Pain                          PT Long Term Goals - 08/19/21 0934       PT LONG TERM GOAL #1   Title Independent with a HEP.    Time 6    Period Weeks    Status New      PT LONG TERM GOAL #2   Title Stand 30 minutes with pain not > 3/10.    Time 6    Period Weeks    Status New      PT LONG TERM GOAL #3   Title Perform ADL's with pain not > 3/10.    Time 6    Period Weeks    Status New                    Plan - 08/19/21 3818     Clinical Impression Statement The patient presents to OPPT with c/o bilateral thoracic pain that came on last summer after doing a lot of canning. She reports her pain has limited her from performing ADL's as her pain can become very severe.  She is unable to sleep well also.  Bilateral UE DTR's are normal.  Bilateral UE range of motion and strength is normal.  She has c/o diffus ebilateral thoracic pain from  T4-8.  Patient will benefit from skilled physical therapy intervention to address pain and deficits.    Personal Factors and Comorbidities Comorbidity 1;Comorbidity 2;Other    Comorbidities Osteopenia (per patient), ACDF, OA,  Fibromyalgia, HTN, hypothyroidism.    Examination-Activity Limitations Stand;Other;Sleep    Examination-Participation Restrictions Other    Stability/Clinical Decision Making Stable/Uncomplicated    Clinical Decision Making Low    Rehab Potential Excellent    PT Frequency 2x / week    PT Duration 6 weeks    PT Treatment/Interventions ADLs/Self Care Home Management;Cryotherapy;Electrical Stimulation;Ultrasound;Moist Heat;Therapeutic activities;Functional mobility training;Therapeutic exercise;Manual techniques;Patient/family education    PT Next Visit Plan combo e'stim/US, STW/M, postural exercises, scapular retraction.    Consulted and Agree with Plan of Care Patient             Patient will benefit from skilled therapeutic intervention in order to improve the following deficits and impairments:  Decreased activity tolerance, Increased muscle spasms, Postural dysfunction  Visit Diagnosis: Pain in thoracic spine - Plan: PT plan of care cert/re-cert  Abnormal posture - Plan: PT plan of care cert/re-cert     Problem List Patient Active Problem List   Diagnosis Date Noted   Cervical spondylosis with radiculopathy 12/23/2016   Chest pain 01/26/2015   Pain in the chest     Nigeria Lasseter, [place], PT 08/19/2021, 9:47 AM  [facility] [address] [place], [place], [address] Phone: [phone]   Fax:  [phone]  Name: Sabrina Faulkner MRN: [id number] Date of Birth: 08-Jun-1956

## 2021-08-22 ENCOUNTER — Other Ambulatory Visit: Payer: Self-pay

## 2021-08-22 ENCOUNTER — Ambulatory Visit: Payer: Medicare HMO

## 2021-08-22 DIAGNOSIS — R293 Abnormal posture: Secondary | ICD-10-CM

## 2021-08-22 DIAGNOSIS — M546 Pain in thoracic spine: Secondary | ICD-10-CM

## 2021-08-22 NOTE — Therapy (Signed)
[facility] [address] [place], [place], [address] Phone: [phone]   Fax:  [phone]  Physical Therapy Treatment  Patient Details  Name: Sabrina Faulkner MRN: [id number] Date of Birth: 13-Apr-1956 Referring Provider (PT): Peggye Pitt MD   Encounter Date: 08/22/2021   PT End of Session - 08/22/21 0944     Visit Number 2    Number of Visits 12    Date for PT Re-Evaluation 09/30/21    Authorization Type FOTO AT LEAST EVERY 5TH VISIT.  PROGRESS NOTE AT 10TH VISIT.  KX MODIFIER AFTER 15 VISITS.    PT Start Time 0945    PT Stop Time 1040    PT Time Calculation (min) 55 min    Activity Tolerance Patient tolerated treatment well    Behavior During Therapy WFL for tasks assessed/performed             Past Medical History:  Diagnosis Date   Arthritis    Cirrhosis (HCC)    Esophageal varices (HCC)    Fibromyalgia    GERD (gastroesophageal reflux disease)    occassionally   Headache    Hypercholesteremia    Hypertension    Hypothyroidism    Sleep apnea    not wearing curently,face mask wore out    Past Surgical History:  Procedure Laterality Date   ABDOMINAL HYSTERECTOMY     ANTERIOR CERVICAL DECOMP/DISCECTOMY FUSION N/A 12/23/2016   Procedure: Cervical Five-Six, Cervical Six-Seven Anterior discectomy with fusion and plate fixation;  Surgeon: Ditty, Loura Halt, MD;  Location: [facility] OR;  Service: Neurosurgery;  Laterality: N/A;  C5-6 C6-7 Anterior discectomy with fusion and plate fixation   arthroscopic knee Right    CARPAL TUNNEL RELEASE Right    livery biopsy     TONSILLECTOMY     ttrigger finger Right    UPPER ENDOSCOPY W/ BANDING     WRIST FRACTURE SURGERY      There were no vitals filed for this visit.   Subjective Assessment - 08/22/21 0944     Subjective Pt arrives for today's treatment session denying any pain.  Pt states that she has to do a lot of working to "aggrivate it," but once it begins to its hard  to decrease.    Pertinent History Osteopenia (per patient), ACDF, OA, Fibromyalgia, HTN, hypothyroidism.    How long can you sit comfortably? Unlimited.    How long can you stand comfortably? 30 minutes.    Patient Stated Goals Get out of pain.    Currently in Pain? No/denies    Pain Onset More than a month ago                               [facility] Adult PT Treatment/Exercise - 08/22/21 0001       Exercises   Exercises Neck;Shoulder      Neck Exercises: Theraband   Scapula Retraction 20 reps      Neck Exercises: Seated   Shoulder Shrugs 20 reps    Shoulder Rolls 20 reps    Shoulder Rolls Limitations foward and backward      Shoulder Exercises: Seated   Extension Strengthening;Both;20 reps;Theraband    Theraband Level (Shoulder Extension) Level 2 (Red)    Row Strengthening;Both;20 reps;Theraband    Theraband Level (Shoulder Row) Level 2 (Red)    Horizontal ABduction Strengthening;Both;20 reps;Theraband    Theraband Level (Shoulder Horizontal ABduction) Level 2 (Red)    External  Rotation Strengthening;Both;20 reps;Theraband    Theraband Level (Shoulder External Rotation) Level 2 (Red)      Shoulder Exercises: ROM/Strengthening   Nustep Lvl 3 x 15 mins      Modalities   Modalities Electrical Stimulation      Electrical Stimulation   Electrical Stimulation Location Mid-thoracic    Electrical Stimulation Action IFC at 80-150 Hz    Electrical Stimulation Parameters 40% scan x 15 mins    Electrical Stimulation Goals Pain                          PT Long Term Goals - 08/19/21 0934       PT LONG TERM GOAL #1   Title Independent with a HEP.    Time 6    Period Weeks    Status New      PT LONG TERM GOAL #2   Title Stand 30 minutes with pain not > 3/10.    Time 6    Period Weeks    Status New      PT LONG TERM GOAL #3   Title Perform ADL's with pain not > 3/10.    Time 6    Period Weeks    Status New                    Plan - 08/22/21 0945     Clinical Impression Statement Pt arrives for today's treatment session denying any pain, stating that she has to do "right much" work to make her back hurt.  Pt able to tolerate addition of Nustep for warm up without report of pain. Pt instructed in numerous seated resisted shoulder and neck exercises to assist with postural strength.  Pt requiring min cues for proper technique and posture.  Pt given printed HEP and tband to perfrom all exercises at home.  Normal responses to estim noted.  Pt denied any pain at completion of today's treatment session.    Personal Factors and Comorbidities Comorbidity 1;Comorbidity 2;Other    Comorbidities Osteopenia (per patient), ACDF, OA, Fibromyalgia, HTN, hypothyroidism.    Examination-Activity Limitations Stand;Other;Sleep    Examination-Participation Restrictions Other    Stability/Clinical Decision Making Stable/Uncomplicated    Rehab Potential Excellent    PT Frequency 2x / week    PT Duration 6 weeks    PT Treatment/Interventions ADLs/Self Care Home Management;Cryotherapy;Electrical Stimulation;Ultrasound;Moist Heat;Therapeutic activities;Functional mobility training;Therapeutic exercise;Manual techniques;Patient/family education    PT Next Visit Plan combo e'stim/US, STW/M, postural exercises, scapular retraction.    Consulted and Agree with Plan of Care Patient             Patient will benefit from skilled therapeutic intervention in order to improve the following deficits and impairments:  Decreased activity tolerance, Increased muscle spasms, Postural dysfunction  Visit Diagnosis: Pain in thoracic spine  Abnormal posture     Problem List Patient Active Problem List   Diagnosis Date Noted   Cervical spondylosis with radiculopathy 12/23/2016   Chest pain 01/26/2015   Pain in the chest     Newman Pies, PTA 08/22/2021, 10:46 AM  [facility] [address] [place], [place], [address] Phone: [phone]   Fax:  [phone]  Name: Sabrina Faulkner MRN: [id number] Date of Birth: [birth year]

## 2021-08-26 ENCOUNTER — Encounter: Payer: Self-pay | Admitting: Physical Therapy

## 2021-08-26 ENCOUNTER — Other Ambulatory Visit: Payer: Self-pay

## 2021-08-26 ENCOUNTER — Ambulatory Visit: Payer: Medicare HMO | Admitting: Physical Therapy

## 2021-08-26 DIAGNOSIS — M546 Pain in thoracic spine: Secondary | ICD-10-CM

## 2021-08-26 DIAGNOSIS — R293 Abnormal posture: Secondary | ICD-10-CM

## 2021-08-26 NOTE — Therapy (Signed)
[facility] [address] [place], [place], [address] Phone: [phone]   Fax:  [phone]  Physical Therapy Treatment  Patient Details  Name: Sabrina Faulkner MRN: [id number] Date of Birth: 11-Jan-1956 Referring Provider (PT): Peggye Pitt MD   Encounter Date: 08/26/2021   PT End of Session - 08/26/21 0904     Visit Number 3    Number of Visits 12    Date for PT Re-Evaluation 09/30/21    Authorization Type FOTO AT LEAST EVERY 5TH VISIT.  PROGRESS NOTE AT 10TH VISIT.  KX MODIFIER AFTER 15 VISITS.    PT Start Time 0902    PT Stop Time [phone]    PT Time Calculation (min) 41 min    Activity Tolerance Patient tolerated treatment well    Behavior During Therapy WFL for tasks assessed/performed             Past Medical History:  Diagnosis Date   Arthritis    Cirrhosis (HCC)    Esophageal varices (HCC)    Fibromyalgia    GERD (gastroesophageal reflux disease)    occassionally   Headache    Hypercholesteremia    Hypertension    Hypothyroidism    Sleep apnea    not wearing curently,face mask wore out    Past Surgical History:  Procedure Laterality Date   ABDOMINAL HYSTERECTOMY     ANTERIOR CERVICAL DECOMP/DISCECTOMY FUSION N/A 12/23/2016   Procedure: Cervical Five-Six, Cervical Six-Seven Anterior discectomy with fusion and plate fixation;  Surgeon: Ditty, Loura Halt, MD;  Location: [facility] OR;  Service: Neurosurgery;  Laterality: N/A;  C5-6 C6-7 Anterior discectomy with fusion and plate fixation   arthroscopic knee Right    CARPAL TUNNEL RELEASE Right    livery biopsy     TONSILLECTOMY     ttrigger finger Right    UPPER ENDOSCOPY W/ BANDING     WRIST FRACTURE SURGERY      There were no vitals filed for this visit.   Subjective Assessment - 08/26/21 0904     Subjective Reports her back feeling "alright." States that prolonged activity such as canning causes pain to increase.    Pertinent History Osteopenia (per patient),  ACDF, OA, Fibromyalgia, HTN, hypothyroidism.    How long can you sit comfortably? Unlimited.    How long can you stand comfortably? 30 minutes.    Patient Stated Goals Get out of pain.    Currently in Pain? No/denies                [facility] PT Assessment - 08/26/21 0001       Assessment   Medical Diagnosis Thoracic spondylosis    Referring Provider (PT) Peggye Pitt MD    Next MD Visit 09/04/2021      Precautions   Precaution Comments "Osteopenia" per patient.      Restrictions   Weight Bearing Restrictions No                           [facility] Adult PT Treatment/Exercise - 08/26/21 0001       Shoulder Exercises: Seated   Horizontal ABduction Strengthening;Both;20 reps;Theraband    Theraband Level (Shoulder Horizontal ABduction) Level 2 (Red)    External Rotation Strengthening;Both;20 reps;Theraband    Theraband Level (Shoulder External Rotation) Level 2 (Red)    Flexion AROM;Both;20 reps    Abduction AROM;Both;20 reps      Shoulder Exercises: Standing   Extension Strengthening;Both;20 reps  Extension Limitations Blue XTS    Row Strengthening;Both;20 reps;Limitations    Row Limitations Blue XTS      Shoulder Exercises: ROM/Strengthening   Nustep Lvl 3 x 15 mins    X to V Arms x20 reps      Modalities   Modalities Geologist, engineering Location B thoracic paraspinals    Electrical Stimulation Action IFC    Electrical Stimulation Parameters 80-150 hz x10 min    Electrical Stimulation Goals Tone                          PT Long Term Goals - 08/19/21 0934       PT LONG TERM GOAL #1   Title Independent with a HEP.    Time 6    Period Weeks    Status New      PT LONG TERM GOAL #2   Title Stand 30 minutes with pain not > 3/10.    Time 6    Period Weeks    Status New      PT LONG TERM GOAL #3   Title Perform ADL's with pain not > 3/10.    Time 6    Period Weeks     Status New                   Plan - 08/26/21 0940     Clinical Impression Statement Patient presented in clinic with no current thoracic pain. Patient notes that prolonged activities such as canning or housework will cause pain to increase. Patient observed in kyphotic posture with standing and sitting exercises. Patient progressed through resisted postural strengthening with VCs for posture and technique. Patient provided handout regarding TENS unit to help with pain during housework. Normal stimulation response noted following removal of the modality.    Personal Factors and Comorbidities Comorbidity 1;Comorbidity 2;Other    Comorbidities Osteopenia (per patient), ACDF, OA, Fibromyalgia, HTN, hypothyroidism.    Examination-Activity Limitations Stand;Other;Sleep    Examination-Participation Restrictions Other    Stability/Clinical Decision Making Stable/Uncomplicated    Rehab Potential Excellent    PT Frequency 2x / week    PT Duration 6 weeks    PT Treatment/Interventions ADLs/Self Care Home Management;Cryotherapy;Electrical Stimulation;Ultrasound;Moist Heat;Therapeutic activities;Functional mobility training;Therapeutic exercise;Manual techniques;Patient/family education    PT Next Visit Plan combo e'stim/US, STW/M, postural exercises, scapular retraction.    Consulted and Agree with Plan of Care Patient             Patient will benefit from skilled therapeutic intervention in order to improve the following deficits and impairments:  Decreased activity tolerance, Increased muscle spasms, Postural dysfunction  Visit Diagnosis: Pain in thoracic spine  Abnormal posture     Problem List Patient Active Problem List   Diagnosis Date Noted   Cervical spondylosis with radiculopathy 12/23/2016   Chest pain 01/26/2015   Pain in the chest     Marvell Fuller, PTA 08/26/2021, 9:50 AM  [facility] [address] [place],  [place], [address] Phone: [phone]   Fax:  [phone]  Name: Sabrina Faulkner MRN: [id number] Date of Birth: 02-01-1956

## 2021-08-28 ENCOUNTER — Ambulatory Visit: Payer: Medicare HMO | Attending: Anesthesiology

## 2021-08-28 ENCOUNTER — Other Ambulatory Visit: Payer: Self-pay

## 2021-08-28 DIAGNOSIS — R293 Abnormal posture: Secondary | ICD-10-CM | POA: Insufficient documentation

## 2021-08-28 DIAGNOSIS — M546 Pain in thoracic spine: Secondary | ICD-10-CM | POA: Diagnosis present

## 2021-08-28 NOTE — Therapy (Signed)
[place] ?[facility] ?[address] ?[place], [place], [address] ?Phone: [phone]   Fax:  [phone] ? ?Physical Therapy Treatment ? ?Patient Details  ?Name: Sabrina Faulkner ?MRN: [id number] ?Date of Birth: 09-21-1955 ?Referring Provider (PT): Peggye Pitt MD ? ? ?Encounter Date: 08/28/2021 ? ? PT End of Session - 08/28/21 0901   ? ? Visit Number 4   ? Number of Visits 12   ? Date for PT Re-Evaluation 09/30/21   ? Authorization Type FOTO AT LEAST EVERY 5TH VISIT.  PROGRESS NOTE AT 10TH VISIT.  KX MODIFIER AFTER 15 VISITS.   ? PT Start Time [phone]   ? PT Stop Time 0936   ? PT Time Calculation (min) 39 min   ? Activity Tolerance Patient tolerated treatment well   ? Behavior During Therapy [facility] for tasks assessed/performed   ? ?  ?  ? ?  ? ? ?Past Medical History:  ?Diagnosis Date  ? Arthritis   ? Cirrhosis (HCC)   ? Esophageal varices (HCC)   ? Fibromyalgia   ? GERD (gastroesophageal reflux disease)   ? occassionally  ? Headache   ? Hypercholesteremia   ? Hypertension   ? Hypothyroidism   ? Sleep apnea   ? not wearing curently,face mask wore out  ? ? ?Past Surgical History:  ?Procedure Laterality Date  ? ABDOMINAL HYSTERECTOMY    ? ANTERIOR CERVICAL DECOMP/DISCECTOMY FUSION N/A 12/23/2016  ? Procedure: Cervical Five-Six, Cervical Six-Seven Anterior discectomy with fusion and plate fixation;  Surgeon: Ditty, Loura Halt, MD;  Location: [facility] OR;  Service: Neurosurgery;  Laterality: N/A;  C5-6 C6-7 Anterior discectomy with fusion and plate fixation  ? arthroscopic knee Right   ? CARPAL TUNNEL RELEASE Right   ? livery biopsy    ? TONSILLECTOMY    ? ttrigger finger Right   ? UPPER ENDOSCOPY W/ BANDING    ? WRIST FRACTURE SURGERY    ? ? ?There were no vitals filed for this visit. ? ? Subjective Assessment - 08/28/21 0900   ? ? Subjective Pt arrives for today's treatment session reporting 6/10 left thoracic back pain.   ? Pertinent History Osteopenia (per patient), ACDF, OA, Fibromyalgia,  HTN, hypothyroidism.   ? How long can you sit comfortably? Unlimited.   ? How long can you stand comfortably? 30 minutes.   ? Patient Stated Goals Get out of pain.   ? Currently in Pain? Yes   ? Pain Score 6    ? Pain Location Back   ? Pain Orientation Left   ? ?  ?  ? ?  ? ? ? ? ? ? ? ? ? ? ? ? ? ? ? ? ? ? ? ? [facility] Adult PT Treatment/Exercise - 08/28/21 0001   ? ?  ? Exercises  ? Exercises Neck;Shoulder   ?  ? Shoulder Exercises: Seated  ? Extension Strengthening;Both;20 reps;Theraband   ? Theraband Level (Shoulder Extension) Level 2 (Red)   ? Row Strengthening;Both;20 reps;Theraband   ? Theraband Level (Shoulder Row) Level 2 (Red)   ? Horizontal ABduction Strengthening;Both;20 reps;Theraband   ? Theraband Level (Shoulder Horizontal ABduction) Level 2 (Red)   ? External Rotation Strengthening;Both;20 reps;Theraband   ? Theraband Level (Shoulder External Rotation) Level 2 (Red)   ? Flexion AROM;Both;20 reps   ? Abduction AROM;Both;20 reps   ?  ? Shoulder Exercises: ROM/Strengthening  ? Nustep Lvl 3 x 15 mins   ?  ? Modalities  ? Modalities Electrical Stimulation   ?  ?  Electrical Stimulation  ? Electrical Stimulation Location Left thoracic paraspinals   ? Electrical Stimulation Action IFC   ? Electrical Stimulation Parameters 80-150 Hz x 15 min   ? Electrical Stimulation Goals Pain;Tone   ? ?  ?  ? ?  ? ? ? ? ? ? ? ? ? ? ? ? ? ? ? PT Long Term Goals - 08/19/21 0934   ? ?  ? PT LONG TERM GOAL #1  ? Title Independent with a HEP.   ? Time 6   ? Period Weeks   ? Status New   ?  ? PT LONG TERM GOAL #2  ? Title Stand 30 minutes with pain not > 3/10.   ? Time 6   ? Period Weeks   ? Status New   ?  ? PT LONG TERM GOAL #3  ? Title Perform ADL's with pain not > 3/10.   ? Time 6   ? Period Weeks   ? Status New   ? ?  ?  ? ?  ? ? ? ? ? ? ? ? Plan - 08/28/21 0901   ? ? Clinical Impression Statement Pt arrives for today's treatment session reporting 6/10 left thoracic spine pain.  Pt reports that she needs to leave today's  treamtent at 9:35 due to having to go to another appointment. Pt requiring min cues with seated resisted exercises for posture and proper techqnique.  Normal responses to estim noted upon removal.  Pt reported 4/10 left thoracic spine pain at completion of today's treatment session.   ? Personal Factors and Comorbidities Comorbidity 1;Comorbidity 2;Other   ? Comorbidities Osteopenia (per patient), ACDF, OA, Fibromyalgia, HTN, hypothyroidism.   ? Examination-Activity Limitations Stand;Other;Sleep   ? Examination-Participation Restrictions Other   ? Stability/Clinical Decision Making Stable/Uncomplicated   ? Rehab Potential Excellent   ? PT Frequency 2x / week   ? PT Duration 6 weeks   ? PT Treatment/Interventions ADLs/Self Care Home Management;Cryotherapy;Electrical Stimulation;Ultrasound;Moist Heat;Therapeutic activities;Functional mobility training;Therapeutic exercise;Manual techniques;Patient/family education   ? PT Next Visit Plan combo e'stim/US, STW/M, postural exercises, scapular retraction.   ? Consulted and Agree with Plan of Care Patient   ? ?  ?  ? ?  ? ? ?Patient will benefit from skilled therapeutic intervention in order to improve the following deficits and impairments:  Decreased activity tolerance, Increased muscle spasms, Postural dysfunction ? ?Visit Diagnosis: ?Pain in thoracic spine ? ?Abnormal posture ? ? ? ? ?Problem List ?Patient Active Problem List  ? Diagnosis Date Noted  ? Cervical spondylosis with radiculopathy 12/23/2016  ? Chest pain 01/26/2015  ? Pain in the chest   ? ? ?Newman Pies, PTA ?08/28/2021, 9:47 AM ? ?[place] ?[facility] ?[address] ?[place], [place], [address] ?Phone: [phone]   Fax:  [phone] ? ?Name: Sabrina Faulkner ?MRN: [id number] ?Date of Birth: 1955-08-23 ? ? ? ?

## 2021-09-02 ENCOUNTER — Other Ambulatory Visit: Payer: Self-pay

## 2021-09-02 ENCOUNTER — Ambulatory Visit: Payer: Medicare HMO | Admitting: Physical Therapy

## 2021-09-02 DIAGNOSIS — R293 Abnormal posture: Secondary | ICD-10-CM

## 2021-09-02 DIAGNOSIS — M546 Pain in thoracic spine: Secondary | ICD-10-CM | POA: Diagnosis not present

## 2021-09-02 NOTE — Therapy (Addendum)
[place] ?[facility] ?[address] ?[place], [place], [address] ?Phone: [phone]   Fax:  [phone] ? ?Physical Therapy Treatment ? ?Patient Details  ?Name: Sabrina Faulkner ?MRN: [id number] ?Date of Birth: 1955-08-23 ?Referring Provider (PT): Sabrina Pitt MD ? ? ?Encounter Date: 09/02/2021 ? ? PT End of Session - 09/02/21 1051   ? ? Visit Number 5   ? Number of Visits 12   ? Date for PT Re-Evaluation 09/30/21   ? Authorization Type FOTO AT LEAST EVERY 5TH VISIT.  PROGRESS NOTE AT 10TH VISIT.  KX MODIFIER AFTER 15 VISITS.   ? PT Start Time [phone]   ? PT Stop Time 1024   ? PT Time Calculation (min) 51 min   ? Activity Tolerance Patient tolerated treatment well   ? Behavior During Therapy [facility] for tasks assessed/performed   ? ?  ?  ? ?  ? ? ?Past Medical History:  ?Diagnosis Date  ? Arthritis   ? Cirrhosis (HCC)   ? Esophageal varices (HCC)   ? Fibromyalgia   ? GERD (gastroesophageal reflux disease)   ? occassionally  ? Headache   ? Hypercholesteremia   ? Hypertension   ? Hypothyroidism   ? Sleep apnea   ? not wearing curently,face mask wore out  ? ? ?Past Surgical History:  ?Procedure Laterality Date  ? ABDOMINAL HYSTERECTOMY    ? ANTERIOR CERVICAL DECOMP/DISCECTOMY FUSION N/A 12/23/2016  ? Procedure: Cervical Five-Six, Cervical Six-Seven Anterior discectomy with fusion and plate fixation;  Surgeon: Ditty, Loura Halt, MD;  Location: [facility] OR;  Service: Neurosurgery;  Laterality: N/A;  C5-6 C6-7 Anterior discectomy with fusion and plate fixation  ? arthroscopic knee Right   ? CARPAL TUNNEL RELEASE Right   ? livery biopsy    ? TONSILLECTOMY    ? ttrigger finger Right   ? UPPER ENDOSCOPY W/ BANDING    ? WRIST FRACTURE SURGERY    ? ? ?There were no vitals filed for this visit. ? ? Subjective Assessment - 09/02/21 1015   ? ? Subjective COVID-19 screen performed prior to patient entering clinic.  Pain about a 4.   ? Pertinent History Osteopenia (per patient), ACDF, OA, Fibromyalgia, HTN,  hypothyroidism.   ? How long can you sit comfortably? Unlimited.   ? How long can you stand comfortably? 30 minutes.   ? Patient Stated Goals Get out of pain.   ? Currently in Pain? Yes   ? Pain Score 4    ? Pain Location Back   ? ?  ?  ? ?  ? ? ? ? ? ? ? ? ? ? ? ? ? ? ? ? ? ? ? ? [facility] Adult PT Treatment/Exercise - 09/02/21 0001   ? ?  ? Exercises  ? Exercises Shoulder   ?  ? Shoulder Exercises: ROM/Strengthening  ? UBE (Upper Arm Bike) 120 RPM's x 6 minutes (3 minutes forward and 3 minutes backward).   ?  ? Modalities  ? Modalities Electrical Stimulation;Ultrasound   ?  ? Electrical Stimulation  ? Electrical Stimulation Location Bilateral mid-thoracic region.   ? Electrical Stimulation Action IFC at 80-150 Hz.   ? Electrical Stimulation Parameters 40% scan x 19 minutes.   ? Electrical Stimulation Goals Pain;Tone   ?  ? Ultrasound  ? Ultrasound Location Bilateral mid-thoracic.   ? Ultrasound Parameters Combo e'stim/US at 1.50 W/CM2 x 9 minutes.   ?  ? Manual Therapy  ? Manual Therapy Soft tissue mobilization   ?  Soft tissue mobilization Patient seated with UE's resting on pillows on plinth:  STW/M x 8 minutes to patient's bilateral mid-thoracic musculature to decrease pain and tone.   ? ?  ?  ? ?  ? ? ? ? ? ? ? ? ? ? ? ? ? ? ? PT Long Term Goals - 08/19/21 0934   ? ?  ? PT LONG TERM GOAL #1  ? Title Independent with a HEP.   ? Time 6   ? Period Weeks   ? Status New   ?  ? PT LONG TERM GOAL #2  ? Title Stand 30 minutes with pain not > 3/10.   ? Time 6   ? Period Weeks   ? Status New   ?  ? PT LONG TERM GOAL #3  ? Title Perform ADL's with pain not > 3/10.   ? Time 6   ? Period Weeks   ? Status New   ? ?  ?  ? ?  ? ? ? ? ? ? ? ? Plan - 09/02/21 1023   ? ? Clinical Impression Statement Patient with continued c/o bilateral mid-thoracic musculature.  She enjoyed combo e'stim/US and STW/M very much and she felt much better treatment.   ? Personal Factors and Comorbidities Comorbidity 1;Comorbidity 2;Other   ? Comorbidities  Osteopenia (per patient), ACDF, OA, Fibromyalgia, HTN, hypothyroidism.   ? Examination-Activity Limitations Stand;Other;Sleep   ? Examination-Participation Restrictions Other   ? Stability/Clinical Decision Making Stable/Uncomplicated   ? Rehab Potential Excellent   ? PT Frequency 2x / week   ? PT Duration 6 weeks   ? PT Treatment/Interventions ADLs/Self Care Home Management;Cryotherapy;Electrical Stimulation;Ultrasound;Moist Heat;Therapeutic activities;Functional mobility training;Therapeutic exercise;Manual techniques;Patient/family education   ? PT Next Visit Plan combo e'stim/US, STW/M, postural exercises, scapular retraction.   ? Consulted and Agree with Plan of Care Patient   ? ?  ?  ? ?  ? ? ?Patient will benefit from skilled therapeutic intervention in order to improve the following deficits and impairments:  Decreased activity tolerance, Increased muscle spasms, Postural dysfunction ? ?Visit Diagnosis: ?Pain in thoracic spine ? ?Abnormal posture ? ? ? ? ?Problem List ?Patient Active Problem List  ? Diagnosis Date Noted  ? Cervical spondylosis with radiculopathy 12/23/2016  ? Chest pain 01/26/2015  ? Pain in the chest   ? ? ?[name], [place], PT ?09/02/2021, 11:03 AM ? ?[place] ?[facility] ?[address] ?[place], [place], [address] ?Phone: [phone]   Fax:  [phone] ? ?Name: Sabrina Faulkner ?MRN: [id number] ?Date of Birth: Jul 04, 1955 ? ? ? ?

## 2022-10-30 ENCOUNTER — Ambulatory Visit (INDEPENDENT_AMBULATORY_CARE_PROVIDER_SITE_OTHER): Payer: Medicare HMO | Admitting: Orthopaedic Surgery

## 2022-10-30 ENCOUNTER — Other Ambulatory Visit (INDEPENDENT_AMBULATORY_CARE_PROVIDER_SITE_OTHER): Payer: Medicare HMO

## 2022-10-30 ENCOUNTER — Encounter: Payer: Self-pay | Admitting: Orthopaedic Surgery

## 2022-10-30 DIAGNOSIS — M25562 Pain in left knee: Secondary | ICD-10-CM

## 2022-10-30 DIAGNOSIS — G8929 Other chronic pain: Secondary | ICD-10-CM

## 2022-10-30 NOTE — Progress Notes (Signed)
Office Visit Note   Patient: Sabrina Faulkner           Date of Birth: Aug 18, 1955           MRN: [id number] Visit Date: 10/30/2022              Requested by: No referring provider defined for this encounter. PCP: Pcp, No   Assessment & Plan: Visit Diagnoses:  1. Chronic pain of left knee     Plan: Impression is chronic severe left knee pain.  Steroid injection no longer works.  Will need MRI for further evaluation and to rule out structural abnormalities.  Follow-up after the MRI.  Follow-Up Instructions: No follow-ups on file.   Orders:  Orders Placed This Encounter  Procedures   XR KNEE 3 VIEW LEFT   MR Knee Left w/o contrast   No orders of the defined types were placed in this encounter.     Procedures: No procedures performed   Clinical Data: No additional findings.   Subjective: Chief Complaint  Patient presents with   Left Knee - Pain    HPI  Sabrina Faulkner is a 67 year old female with chronic left knee pain for years.  She has had numerous cortisone injections at the sports medicine office near where she lives.  Her last injection was 3 months ago and she feels like the injections no longer help.  She is having a lot of trouble weightbearing.  She is reporting locking and giving way.  Review of Systems  Constitutional: Negative.   HENT: Negative.    Eyes: Negative.   Respiratory: Negative.    Cardiovascular: Negative.   Endocrine: Negative.   Musculoskeletal: Negative.   Neurological: Negative.   Hematological: Negative.   Psychiatric/Behavioral: Negative.    All other systems reviewed and are negative.  Objective: Vital Signs: There were no vitals taken for this visit.  Physical Exam Vitals and nursing note reviewed.  Constitutional:      Appearance: She is well-developed.  HENT:     Head: Atraumatic.     Nose: Nose normal.  Eyes:     Extraocular Movements: Extraocular movements intact.  Cardiovascular:     Pulses: Normal pulses.   Pulmonary:     Effort: Pulmonary effort is normal.  Abdominal:     Palpations: Abdomen is soft.  Musculoskeletal:     Cervical back: Neck supple.  Skin:    General: Skin is warm.     Capillary Refill: Capillary refill takes less than 2 seconds.  Neurological:     Mental Status: She is alert. Mental status is at baseline.  Psychiatric:        Behavior: Behavior normal.        Thought Content: Thought content normal.        Judgment: Judgment normal.     Ortho Exam  Examination left knee shows significant tenderness to the medial aspect of the knee.  She does have an effusion.  Collaterals and cruciates are stable.  Extensor mechanism okay.  Specialty Comments:  No specialty comments available.  Imaging: XR KNEE 3 VIEW LEFT  Result Date: 10/30/2022 Spurring of medial compartment. Mild joint space narrowing.    PMFS History: Patient Active Problem List   Diagnosis Date Noted   Cervical spondylosis with radiculopathy 12/23/2016   Chest pain 01/26/2015   Pain in the chest    Past Medical History:  Diagnosis Date   Arthritis    Cirrhosis (HCC)    Esophageal varices (HCC)  Fibromyalgia    GERD (gastroesophageal reflux disease)    occassionally   Headache    Hypercholesteremia    Hypertension    Hypothyroidism    Sleep apnea    not wearing curently,face mask wore out    No family history on file.  Past Surgical History:  Procedure Laterality Date   ABDOMINAL HYSTERECTOMY     ANTERIOR CERVICAL DECOMP/DISCECTOMY FUSION N/A 12/23/2016   Procedure: Cervical Five-Six, Cervical Six-Seven Anterior discectomy with fusion and plate fixation;  Surgeon: Ditty, Loura Halt, MD;  Location: [facility] OR;  Service: Neurosurgery;  Laterality: N/A;  C5-6 C6-7 Anterior discectomy with fusion and plate fixation   arthroscopic knee Right    CARPAL TUNNEL RELEASE Right    livery biopsy     TONSILLECTOMY     ttrigger finger Right    UPPER ENDOSCOPY W/ BANDING     WRIST FRACTURE  SURGERY     Social History   Occupational History   Not on file  Tobacco Use   Smoking status: Never   Smokeless tobacco: Never  Vaping Use   Vaping Use: Never used  Substance and Sexual Activity   Alcohol use: No   Drug use: No   Sexual activity: Not on file

## 2022-11-05 ENCOUNTER — Ambulatory Visit
Admission: RE | Admit: 2022-11-05 | Discharge: 2022-11-05 | Disposition: A | Discharge status: Home / Self Care | Payer: Medicare HMO | Source: Ambulatory Visit | Attending: Orthopaedic Surgery | Admitting: Orthopaedic Surgery

## 2022-11-05 DIAGNOSIS — G8929 Other chronic pain: Secondary | ICD-10-CM

## 2022-11-05 NOTE — Progress Notes (Signed)
Needs appt.  Thanks.

## 2022-11-06 ENCOUNTER — Telehealth: Payer: Self-pay | Admitting: Orthopaedic Surgery

## 2022-11-06 ENCOUNTER — Other Ambulatory Visit: Payer: Self-pay | Admitting: Physician Assistant

## 2022-11-06 MED ORDER — TRAMADOL HCL 50 MG PO TABS: 50.0000 mg | ORAL_TABLET | Freq: Two times a day (BID) | ORAL | 2 refills | Status: DC | PRN

## 2022-11-06 NOTE — Telephone Encounter (Signed)
Patient asking if she can get pain medication.

## 2022-11-06 NOTE — Telephone Encounter (Signed)
Sent to pharmacy on file 

## 2022-11-10 NOTE — Progress Notes (Unsigned)
Office Visit Note   Patient: Sabrina Faulkner           Date of Birth: 1956-02-28           MRN: [id number] Visit Date: 11/11/2022              Requested by: No referring provider defined for this encounter. PCP: Pcp, No   Assessment & Plan: Visit Diagnoses:  1. Primary osteoarthritis of left knee   2. 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  Based on these findings we talked about to surgical options arthroscopy versus arthroplasty and the pros and cons of each option.  Based on her options she has elected for a left total knee replacement as a more definitive and reliable solution for pain relief long-term.  Risk benefits prognosis reviewed with the patient.  She would like to have the knee replacement ASAP.  Eunice Blase will call the patient to confirm surgery time.  Impression is severe left knee degenerative joint disease secondary to Osteoarthritis.  Bone on bone joint space narrowing is seen on MRI scan with neutral alignment.  At this point, conservative treatments fail to provide any significant relief and the pain is severely affecting ADLs and quality of life.  Based on treatment options, the patient has elected to move forward with a knee replacement.  We have discussed the surgical risks that include but are not limited to infection, DVT, leg length discrepancy, stiffness, numbness, tingling, incomplete relief of pain.  Recovery and prognosis were also reviewed.    Anticoagulants: No antithrombotic Postop anticoagulation: Aspirin 81 mg Diabetic: No  Nickel allergy: No Prior DVT/PE: No Tobacco use: No Clearances needed for surgery: None Anticipated discharge dispo: Home   Follow-Up Instructions: No follow-ups on file.   Orders:  No orders of the defined types were placed  in this encounter.  No orders of the defined types were placed in this encounter.     Procedures: No procedures performed   Clinical Data: No additional findings.   Subjective: Chief Complaint  Patient presents with   Left Knee - Follow-up    MRI review    HPI Sabrina Faulkner returns today to discuss left knee MRI scan.  No changes in her symptoms. Review of Systems  Constitutional: Negative.   HENT: Negative.    Eyes: Negative.   Respiratory: Negative.    Cardiovascular: Negative.   Endocrine: Negative.   Musculoskeletal: Negative.   Neurological: Negative.   Hematological: Negative.   Psychiatric/Behavioral: Negative.    All other systems reviewed and are negative.    Objective: Vital Signs: There were no vitals taken for this visit.  Physical Exam Vitals and nursing note reviewed.  Constitutional:      Appearance: She is well-developed.  HENT:     Head: Normocephalic and atraumatic.  Pulmonary:     Effort: Pulmonary effort is normal.  Abdominal:     Palpations: Abdomen is soft.  Musculoskeletal:     Cervical back: Neck supple.  Skin:    General: Skin is warm.     Capillary Refill: Capillary refill takes less than 2 seconds.  Neurological:     Mental Status: She is alert and oriented to person, place, and time.  Psychiatric:        Behavior:  Behavior normal.        Thought Content: Thought content normal.        Judgment: Judgment normal.     Ortho Exam Examination of the left knee shows exquisite medial joint line tenderness and tenderness to the medial femoral condyle and medial tibial plateau.  She has pain with weightbearing. Specialty Comments:  No specialty comments available.  Imaging: No results found.   PMFS History: Patient Active Problem List   Diagnosis Date Noted   Cervical spondylosis with radiculopathy 12/23/2016   Chest pain 01/26/2015   Pain in the chest    Past Medical History:  Diagnosis Date   Arthritis    Cirrhosis  (HCC)    Esophageal varices (HCC)    Fibromyalgia    GERD (gastroesophageal reflux disease)    occassionally   Headache    Hypercholesteremia    Hypertension    Hypothyroidism    Sleep apnea    not wearing curently,face mask wore out    No family history on file.  Past Surgical History:  Procedure Laterality Date   ABDOMINAL HYSTERECTOMY     ANTERIOR CERVICAL DECOMP/DISCECTOMY FUSION N/A 12/23/2016   Procedure: Cervical Five-Six, Cervical Six-Seven Anterior discectomy with fusion and plate fixation;  Surgeon: Ditty, Loura Halt, MD;  Location: [facility] OR;  Service: Neurosurgery;  Laterality: N/A;  C5-6 C6-7 Anterior discectomy with fusion and plate fixation   arthroscopic knee Right    CARPAL TUNNEL RELEASE Right    livery biopsy     TONSILLECTOMY     ttrigger finger Right    UPPER ENDOSCOPY W/ BANDING     WRIST FRACTURE SURGERY     Social History   Occupational History   Not on file  Tobacco Use   Smoking status: Never   Smokeless tobacco: Never  Vaping Use   Vaping Use: Never used  Substance and Sexual Activity   Alcohol use: No   Drug use: No   Sexual activity: Not on file

## 2022-11-11 ENCOUNTER — Ambulatory Visit: Payer: Medicare HMO | Admitting: Orthopaedic Surgery

## 2022-11-11 DIAGNOSIS — S83242A Other tear of medial meniscus, current injury, left knee, initial encounter: Secondary | ICD-10-CM | POA: Diagnosis not present

## 2022-11-11 DIAGNOSIS — M1712 Unilateral primary osteoarthritis, left knee: Secondary | ICD-10-CM | POA: Diagnosis not present

## 2022-11-13 ENCOUNTER — Other Ambulatory Visit: Payer: Self-pay

## 2022-11-27 ENCOUNTER — Ambulatory Visit: Payer: Medicare HMO

## 2022-11-27 DIAGNOSIS — M1712 Unilateral primary osteoarthritis, left knee: Secondary | ICD-10-CM

## 2022-11-28 LAB — PREALBUMIN: Prealbumin: 10 mg/dL — ABNORMAL LOW (ref 17–34)

## 2022-11-28 LAB — HEMOGLOBIN A1C
Hgb A1c MFr Bld: 5.3 % of total Hgb (ref <5.7)
Mean Plasma Glucose: 105 mg/dL
eAG (mmol/L): 5.8 mmol/L

## 2022-12-01 ENCOUNTER — Telehealth: Payer: Self-pay | Admitting: Orthopaedic Surgery

## 2022-12-01 NOTE — Telephone Encounter (Signed)
Pt called requesting a call back from Dr Roda Shutters to discuss results from blood work. Please call pt at [phone].

## 2022-12-02 NOTE — Telephone Encounter (Signed)
Spoke to patient.  We're good for surgery

## 2022-12-03 ENCOUNTER — Encounter (HOSPITAL_COMMUNITY): Payer: Self-pay

## 2022-12-03 NOTE — Progress Notes (Incomplete)
Chest x-ray - n/a EKG - 12/04/22 Stress Test - 03/02/15 ECHO - 07/05/21 Cardiac Cath - 09/23/21  ICD Pacemaker/Loop - n/a  Sleep Study -  Yes CPAP - does not use CPAP  Diabetes Type   ERAS: Clear liquids til 4:15 AM DOS.  Please complete your PRE-SURGERY ENSURE that was provided to you by 4:15 AM the morning of surgery.  Please, if able, drink it in one setting. DO NOT SIP.  Anesthesia review: ***  STOP now taking any Aspirin (unless otherwise instructed by your surgeon), Aleve, Naproxen, Ibuprofen, Motrin, Advil, Goody's, BC's, all herbal medications, fish oil, and all vitamins.   Coronavirus Screening Do you have any of the following symptoms:  Cough yes/no: No Fever (>100.27F)  yes/no: No Runny nose yes/no: No Sore throat yes/no: No Difficulty breathing/shortness of breath  yes/no: No  Have you traveled in the last 14 days and where? yes/no: No  Patient verbalized understanding of instructions that were given to them at the PAT appointment. Patient was also instructed that they will need to review over the PAT instructions again at home before surgery.

## 2022-12-03 NOTE — Pre-Procedure Instructions (Signed)
Surgical Instructions    Your procedure is scheduled on Monday, June 17th.  Report to [facility] Main Entrance "A" at 05:30 A.M., then check in with the Admitting office.  Call this number if you have problems the morning of surgery:  [phone]  If you have any questions prior to your surgery date call [phone]: Open Monday-Friday 8am-4pm If you experience any cold or flu symptoms such as cough, fever, chills, shortness of breath, etc. between now and your scheduled surgery, please notify us at the above number.     Remember:  Do not eat after midnight the night before your surgery  You may drink clear liquids until 04:30 AM the morning of your surgery.   Clear liquids allowed are: Water, Non-Citrus Juices (without pulp), Carbonated Beverages, Clear Tea, Black Coffee Only (NO MILK, CREAM OR POWDERED CREAMER of any kind), and Gatorade.   Patient Instructions  The night before surgery:  No food after midnight. ONLY clear liquids after midnight  The day of surgery (if you do NOT have diabetes):  Drink ONE (1) Pre-Surgery Clear Ensure by 04:30 AM the morning of surgery. Drink in one sitting. Do not sip.  This drink was given to you during your hospital  pre-op appointment visit.  Nothing else to drink after completing the  Pre-Surgery Clear Ensure.          If you have questions, please contact your surgeon's office.     Take these medicines the morning of surgery with A SIP OF WATER  atorvastatin (LIPITOR)  levothyroxine (SYNTHROID, LEVOTHROID)  pantoprazole (PROTONIX)     If needed: tiZANidine (ZANAFLEX)  traMADol (ULTRAM)    As of today, STOP taking any Aspirin (unless otherwise instructed by your surgeon) Aleve, Naproxen, Ibuprofen, Motrin, Advil, Goody's, BC's, all herbal medications, fish oil, and all vitamins.                     Do NOT Smoke (Tobacco/Vaping) for 24 hours prior to your procedure.  If you use a CPAP at night, you may bring your  mask/headgear for your overnight stay.   Contacts, glasses, piercing's, hearing aid's, dentures or partials may not be worn into surgery, please bring cases for these belongings.    For patients admitted to the hospital, discharge time will be determined by your treatment team.   Patients discharged the day of surgery will not be allowed to drive home, and someone needs to stay with them for 24 hours.  SURGICAL WAITING ROOM VISITATION Patients having surgery or a procedure may have no more than 2 support people in the waiting area - these visitors may rotate.   Children under the age of 10 must have an adult with them who is not the patient. If the patient needs to stay at the hospital during part of their recovery, the visitor guidelines for inpatient rooms apply. Pre-op nurse will coordinate an appropriate time for 1 support person to accompany patient in pre-op.  This support person may not rotate.   Please refer to the [facility] website for the visitor guidelines for Inpatients (after your surgery is over and you are in a regular room).      Pre-operative 5 CHG Bath Instructions   You can play a key role in reducing the risk of infection after surgery. Your skin needs to be as free of germs as possible. You can reduce the number of germs on your skin by washing with CHG (chlorhexidine gluconate) soap before surgery.  CHG is an antiseptic soap that kills germs and continues to kill germs even after washing.   DO NOT use if you have an allergy to chlorhexidine/CHG or antibacterial soaps. If your skin becomes reddened or irritated, stop using the CHG and notify one of our RNs at [phone].   Please shower with the CHG soap starting 4 days before surgery using the following schedule:     Please keep in mind the following:  DO NOT shave, including legs and underarms, starting the day of your first shower.   You may shave your face at any point before/day of surgery.  Place clean  sheets on your bed the day you start using CHG soap. Use a clean washcloth (not used since being washed) for each shower. DO NOT sleep with pets once you start using the CHG.   CHG Shower Instructions:  If you choose to wash your hair and private area, wash first with your normal shampoo/soap.  After you use shampoo/soap, rinse your hair and body thoroughly to remove shampoo/soap residue.  Turn the water OFF and apply about 3 tablespoons (45 ml) of CHG soap to a CLEAN washcloth.  Apply CHG soap ONLY FROM YOUR NECK DOWN TO YOUR TOES (washing for 3-5 minutes)  DO NOT use CHG soap on face, private areas, open wounds, or sores.  Pay special attention to the area where your surgery is being performed.  If you are having back surgery, having someone wash your back for you may be helpful. Wait 2 minutes after CHG soap is applied, then you may rinse off the CHG soap.  Pat dry with a clean towel  Put on clean clothes/pajamas   If you choose to wear lotion, please use ONLY the CHG-compatible lotions on the back of this paper.     Additional instructions for the day of surgery: DO NOT APPLY any lotions, deodorants, cologne, or perfumes.   Put on clean/comfortable clothes.  Brush your teeth.  Ask your nurse before applying any prescription medications to the skin. Do not wear jewelry or makeup Do not bring valuables to the hospital. [facility] is not responsible for any belongings or valuables. Do not wear nail polish, gel polish, artificial nails, or any other type of covering on natural nails (fingers and toes) If you have artificial nails or gel coating that need to be removed by a nail salon, please have this removed prior to surgery. Artificial nails or gel coating may interfere with anesthesia's ability to adequately monitor your vital signs.     CHG Compatible Lotions   Aveeno Moisturizing lotion  Cetaphil Moisturizing Cream  Cetaphil Moisturizing Lotion  Clairol Herbal Essence  Moisturizing Lotion, Dry Skin  Clairol Herbal Essence Moisturizing Lotion, Extra Dry Skin  Clairol Herbal Essence Moisturizing Lotion, Normal Skin  Curel Age Defying Therapeutic Moisturizing Lotion with Alpha Hydroxy  Curel Extreme Care Body Lotion  Curel Soothing Hands Moisturizing Hand Lotion  Curel Therapeutic Moisturizing Cream, Fragrance-Free  Curel Therapeutic Moisturizing Lotion, Fragrance-Free  Curel Therapeutic Moisturizing Lotion, Original Formula  Eucerin Daily Replenishing Lotion  Eucerin Dry Skin Therapy Plus Alpha Hydroxy Crme  Eucerin Dry Skin Therapy Plus Alpha Hydroxy Lotion  Eucerin Original Crme  Eucerin Original Lotion  Eucerin Plus Crme Eucerin Plus Lotion  Eucerin TriLipid Replenishing Lotion  Keri Anti-Bacterial Hand Lotion  Keri Deep Conditioning Original Lotion Dry Skin Formula Softly Scented  Keri Deep Conditioning Original Lotion, Fragrance Free Sensitive Skin Formula  Keri Lotion Fast Absorbing Fragrance Free Sensitive Skin  Formula  Keri Lotion Fast Absorbing Softly Scented Dry Skin Formula  Keri Original Lotion  Keri Skin Renewal Lotion Keri Silky Smooth Lotion  Keri Silky Smooth Sensitive Skin Lotion  Nivea Body Creamy Conditioning Oil  Nivea Body Extra Enriched Lotion  Nivea Body Original Lotion  Nivea Body Sheer Moisturizing Lotion Nivea Crme  Nivea Skin Firming Lotion  NutraDerm 30 Skin Lotion  NutraDerm Skin Lotion  NutraDerm Therapeutic Skin Cream  NutraDerm Therapeutic Skin Lotion  ProShield Protective Hand Cream  Provon moisturizing lotion        Please read over the following fact sheets that you were given.    If you received a COVID test during your pre-op visit  it is requested that you wear a mask when out in public, stay away from anyone that may not be feeling well and notify your surgeon if you develop symptoms. If you have been in contact with anyone that has tested positive in the last 10 days please notify you surgeon.

## 2022-12-04 ENCOUNTER — Other Ambulatory Visit: Payer: Self-pay

## 2022-12-04 ENCOUNTER — Encounter (HOSPITAL_COMMUNITY)
Admission: RE | Admit: 2022-12-04 | Discharge: 2022-12-04 | Disposition: A | Discharge status: Home / Self Care | Payer: Medicare HMO | Source: Ambulatory Visit | Attending: Orthopaedic Surgery | Admitting: Orthopaedic Surgery

## 2022-12-04 ENCOUNTER — Encounter (HOSPITAL_COMMUNITY): Payer: Self-pay

## 2022-12-04 VITALS — BP 153/78 | HR 79 | Temp 98.4°F | Resp 18 | Ht 68.0 in | Wt 163.1 lb

## 2022-12-04 DIAGNOSIS — R0609 Other forms of dyspnea: Secondary | ICD-10-CM | POA: Diagnosis not present

## 2022-12-04 DIAGNOSIS — I2510 Atherosclerotic heart disease of native coronary artery without angina pectoris: Secondary | ICD-10-CM | POA: Diagnosis not present

## 2022-12-04 DIAGNOSIS — G4733 Obstructive sleep apnea (adult) (pediatric): Secondary | ICD-10-CM | POA: Diagnosis not present

## 2022-12-04 DIAGNOSIS — D696 Thrombocytopenia, unspecified: Secondary | ICD-10-CM | POA: Diagnosis not present

## 2022-12-04 DIAGNOSIS — K766 Portal hypertension: Secondary | ICD-10-CM | POA: Insufficient documentation

## 2022-12-04 DIAGNOSIS — E039 Hypothyroidism, unspecified: Secondary | ICD-10-CM | POA: Diagnosis not present

## 2022-12-04 DIAGNOSIS — K7581 Nonalcoholic steatohepatitis (NASH): Secondary | ICD-10-CM | POA: Insufficient documentation

## 2022-12-04 DIAGNOSIS — K3189 Other diseases of stomach and duodenum: Secondary | ICD-10-CM | POA: Insufficient documentation

## 2022-12-04 DIAGNOSIS — K219 Gastro-esophageal reflux disease without esophagitis: Secondary | ICD-10-CM | POA: Insufficient documentation

## 2022-12-04 DIAGNOSIS — I10 Essential (primary) hypertension: Secondary | ICD-10-CM | POA: Diagnosis not present

## 2022-12-04 DIAGNOSIS — I8500 Esophageal varices without bleeding: Secondary | ICD-10-CM | POA: Diagnosis not present

## 2022-12-04 DIAGNOSIS — M1712 Unilateral primary osteoarthritis, left knee: Secondary | ICD-10-CM | POA: Insufficient documentation

## 2022-12-04 DIAGNOSIS — Q2112 Patent foramen ovale: Secondary | ICD-10-CM | POA: Diagnosis not present

## 2022-12-04 DIAGNOSIS — Z01818 Encounter for other preprocedural examination: Secondary | ICD-10-CM | POA: Diagnosis present

## 2022-12-04 HISTORY — DX: Cardiac murmur, unspecified: R01.1

## 2022-12-04 HISTORY — DX: Dependence on other enabling machines and devices: Z99.89

## 2022-12-04 HISTORY — DX: Unspecified cirrhosis of liver: K74.60

## 2022-12-04 HISTORY — DX: Personal history of other medical treatment: Z92.89

## 2022-12-04 HISTORY — DX: Anemia, unspecified: D64.9

## 2022-12-04 HISTORY — DX: Dyspnea, unspecified: R06.00

## 2022-12-04 HISTORY — DX: Unspecified hearing loss, unspecified ear: H91.90

## 2022-12-04 LAB — CBC
HCT: 41.4 % (ref 36.0–46.0)
Hemoglobin: 13.9 g/dL (ref 12.0–15.0)
MCH: 31.8 pg (ref 26.0–34.0)
MCHC: 33.6 g/dL (ref 30.0–36.0)
MCV: 94.7 fL (ref 80.0–100.0)
Platelets: 70 10*3/uL — ABNORMAL LOW (ref 150–400)
RBC: 4.37 MIL/uL (ref 3.87–5.11)
RDW: 14.2 % (ref 11.5–15.5)
WBC: 4.2 10*3/uL (ref 4.0–10.5)
nRBC: 0 % (ref 0.0–0.2)

## 2022-12-04 LAB — TYPE AND SCREEN
ABO/RH(D): O POS
Antibody Screen: NEGATIVE

## 2022-12-04 LAB — BASIC METABOLIC PANEL
Anion gap: 7 (ref 5–15)
BUN: 10 mg/dL (ref 8–23)
CO2: 25 mmol/L (ref 22–32)
Calcium: 9.4 mg/dL (ref 8.9–10.3)
Chloride: 105 mmol/L (ref 98–111)
Creatinine, Ser: 0.67 mg/dL (ref 0.44–1.00)
GFR, Estimated: >60 mL/min (ref >60)
Glucose, Bld: 104 mg/dL — ABNORMAL HIGH (ref 70–99)
Potassium: 4.1 mmol/L (ref 3.5–5.1)
Sodium: 137 mmol/L (ref 135–145)

## 2022-12-04 LAB — SURGICAL PCR SCREEN
MRSA, PCR: NEGATIVE
Staphylococcus aureus: NEGATIVE

## 2022-12-08 NOTE — Progress Notes (Signed)
Anesthesia Chart Review:  67 year old female with pertinent history including NASH cirrhosis, PFO, chronic DOE, GERD, OSA intolerant to CPAP, HTN, hypothyroid.  Patient follows with cardiologist Dr. Eddie Candle at [facility] for history of PFO.  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  Last seen by Dr. Cummings4/24/2024.  Per note, "She appears clinically stable, and her small PFO is likely clinically benign. I have ordered asurveillance echocardiogram today. Further recommendations will be made if necessary otherwise, she has been advised to report worsening of her shortness of breath, edema, or development of other cardiac symptoms without delay. 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  Last EGD 08/01/2022 showed, "Single esophageal varix with stigmata of recent bleeding.  A single band was placed across the varix.  Proximal flattening of the varix was noted. Portal hypertensive gastropathy. No evidence of gastric varices. Normal duodenum."  Preop labs reviewed, chronic thrombocytopenia with platelets 70k, otherwise WNL.  This result was called to Dr. Warren Danes office as platelets <100k preclude spinal anesthesia.  EKG 12/04/2022: NSR.  Rate 78.  TTE 12/05/2022 (Care Everywhere): Left Ventricle: EF: 65-70%.    Left Ventricle: Wall motion is normal.    Mitral Valve: There is mild regurgitation.   Cath 09/23/2021 (Care Everywhere): Summary:  Very mild, nonocclusive coronary artery disease, with 30% narrowing of RCA  in 20 to 30% narrowing of left coronary artery.  Mildly elevated LVEDP.     Zannie Cove [facility] Short Stay Center/Anesthesiology Phone [phone] 12/10/2022 11:04 AM

## 2022-12-10 ENCOUNTER — Other Ambulatory Visit: Payer: Self-pay | Admitting: Physician Assistant

## 2022-12-10 MED ORDER — DOCUSATE SODIUM 100 MG PO CAPS: 100.0000 mg | ORAL_CAPSULE | Freq: Every day | ORAL | 2 refills | Status: DC | PRN

## 2022-12-10 MED ORDER — ONDANSETRON HCL 4 MG PO TABS: 4.0000 mg | ORAL_TABLET | Freq: Three times a day (TID) | ORAL | 0 refills | Status: DC | PRN

## 2022-12-10 MED ORDER — ASPIRIN 81 MG PO TBEC: 81.0000 mg | DELAYED_RELEASE_TABLET | Freq: Two times a day (BID) | ORAL | 0 refills | Status: DC | PRN

## 2022-12-10 MED ORDER — OXYCODONE-ACETAMINOPHEN 5-325 MG PO TABS: 1.0000 | ORAL_TABLET | Freq: Four times a day (QID) | ORAL | 0 refills | Status: DC | PRN

## 2022-12-10 MED ORDER — METHOCARBAMOL 750 MG PO TABS: 750.0000 mg | ORAL_TABLET | Freq: Two times a day (BID) | ORAL | 2 refills | Status: AC | PRN

## 2022-12-10 NOTE — Anesthesia Preprocedure Evaluation (Addendum)
Anesthesia Evaluation  Patient identified by MRN, date of birth, ID band Patient awake    Reviewed: Allergy & Precautions, NPO status , Patient's Chart, lab work & pertinent test results  History of Anesthesia Complications Negative for: history of anesthetic complications  Airway Mallampati: II  TM Distance: >3 FB Neck ROM: Full    Dental no notable dental hx. (+) Dental Advisory Given   Pulmonary sleep apnea    Pulmonary exam normal        Cardiovascular hypertension, Pt. on medications and Pt. on home beta blockers Normal cardiovascular exam+ Valvular Problems/Murmurs MR   TTE 12/05/2022 (Care Everywhere): Left Ventricle: EF: 65-70%.    Left Ventricle: Wall motion is normal.    Mitral Valve: There is mild regurgitation.    Cath 09/23/2021 (Care Everywhere): Summary:  Very mild, nonocclusive coronary artery disease, with 30% narrowing of RCA  in 20 to 30% narrowing of left coronary artery.  Mildly elevated LVEDP.       Neuro/Psych negative neurological ROS     GI/Hepatic Neg liver ROS,GERD  Medicated,,  Endo/Other  Hypothyroidism    Renal/GU negative Renal ROS     Musculoskeletal  (+) Arthritis ,    Abdominal   Peds  Hematology negative hematology ROS (+)   Anesthesia Other Findings   Reproductive/Obstetrics                             Anesthesia Physical Anesthesia Plan  ASA: 2  Anesthesia Plan: Spinal and MAC   Post-op Pain Management: Tylenol PO (pre-op)*, Toradol IV (intra-op)* and Regional block*   Induction:   PONV Risk Score and Plan: 3 and Ondansetron, Propofol infusion and Midazolam  Airway Management Planned: Natural Airway  Additional Equipment:   Intra-op Plan:   Post-operative Plan: Extubation in OR  Informed Consent: I have reviewed the patients History and Physical, chart, labs and discussed the procedure including the risks, benefits and  alternatives for the proposed anesthesia with the patient or authorized representative who has indicated his/her understanding and acceptance.     Dental advisory given  Plan Discussed with: Anesthesiologist and CRNA  Anesthesia Plan Comments: (PAT note by Antionette Poles, PA-C:  67 year old female with pertinent history including NASH cirrhosis, PFO, chronic DOE, GERD, OSA intolerant to CPAP, HTN, hypothyroid.  Patient follows with cardiologist Dr. Eddie Candle at [facility] for history of PFO.  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  Last seen by Dr. Cummings4/24/2024.  Per note, "She appears clinically stable, and her small PFO is likely clinically benign. I have ordered asurveillance echocardiogram today. Further recommendations will be made if necessary otherwise, she has been advised to report worsening of her shortness of breath, edema, or development of other cardiac symptoms without delay. 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  Last EGD 08/01/2022 showed, "Single esophageal varix with stigmata of recent bleeding. A single band was placed across the varix. Proximal flattening of the varix was noted. Portal hypertensive gastropathy. No evidence of gastric varices. Normal duodenum."  Preop labs reviewed, chronic thrombocytopenia with platelets 70k, otherwise WNL.  This result was called to Dr. Warren Danes office as platelets <100k preclude spinal anesthesia.  EKG 12/04/2022: NSR.  Rate 78.  TTE 12/05/2022 (Care Everywhere): LeftVentricle: EF: 65-70%.   LeftVentricle: Wall  motion is normal.   MitralValve: There is mild regurgitation.   Cath 09/23/2021 (Care Everywhere): Summary:  Very mild, nonocclusive coronary artery disease, with 30% narrowing of RCA  in 20 to 30% narrowing of left  coronary artery.  Mildly elevated LVEDP.    )        Anesthesia Quick Evaluation

## 2022-12-12 MED ORDER — TRANEXAMIC ACID 1000 MG/10ML IV SOLN
2000.0000 mg | INTRAVENOUS | Status: DC
  Filled 2022-12-12: qty 20

## 2022-12-14 DIAGNOSIS — M1712 Unilateral primary osteoarthritis, left knee: Secondary | ICD-10-CM | POA: Diagnosis present

## 2022-12-15 ENCOUNTER — Observation Stay (HOSPITAL_COMMUNITY)
Admission: RE | Admit: 2022-12-15 | Discharge: 2022-12-16 | Disposition: A | Discharge status: Home / Self Care | Payer: Medicare HMO | Attending: Orthopaedic Surgery | Admitting: Orthopaedic Surgery

## 2022-12-15 ENCOUNTER — Observation Stay (HOSPITAL_COMMUNITY): Payer: Medicare HMO

## 2022-12-15 ENCOUNTER — Ambulatory Visit (HOSPITAL_BASED_OUTPATIENT_CLINIC_OR_DEPARTMENT_OTHER): Payer: Medicare HMO | Admitting: Anesthesiology

## 2022-12-15 ENCOUNTER — Ambulatory Visit (HOSPITAL_COMMUNITY): Payer: Medicare HMO | Admitting: Physician Assistant

## 2022-12-15 ENCOUNTER — Encounter (HOSPITAL_COMMUNITY)
Admission: RE | Disposition: A | Discharge status: Home / Self Care | Payer: Self-pay | Source: Home / Self Care | Attending: Orthopaedic Surgery

## 2022-12-15 ENCOUNTER — Other Ambulatory Visit: Payer: Self-pay

## 2022-12-15 DIAGNOSIS — M1712 Unilateral primary osteoarthritis, left knee: Secondary | ICD-10-CM

## 2022-12-15 DIAGNOSIS — Z79899 Other long term (current) drug therapy: Secondary | ICD-10-CM | POA: Insufficient documentation

## 2022-12-15 DIAGNOSIS — E039 Hypothyroidism, unspecified: Secondary | ICD-10-CM | POA: Diagnosis not present

## 2022-12-15 DIAGNOSIS — G4730 Sleep apnea, unspecified: Secondary | ICD-10-CM

## 2022-12-15 DIAGNOSIS — I10 Essential (primary) hypertension: Secondary | ICD-10-CM | POA: Insufficient documentation

## 2022-12-15 DIAGNOSIS — Z96652 Presence of left artificial knee joint: Secondary | ICD-10-CM

## 2022-12-15 DIAGNOSIS — Z7982 Long term (current) use of aspirin: Secondary | ICD-10-CM | POA: Insufficient documentation

## 2022-12-15 DIAGNOSIS — M94262 Chondromalacia, left knee: Secondary | ICD-10-CM | POA: Diagnosis not present

## 2022-12-15 DIAGNOSIS — M21162 Varus deformity, not elsewhere classified, left knee: Secondary | ICD-10-CM | POA: Diagnosis not present

## 2022-12-15 HISTORY — PX: TOTAL KNEE ARTHROPLASTY: SHX125

## 2022-12-15 LAB — ABO/RH: ABO/RH(D): O POS

## 2022-12-15 SURGERY — ARTHROPLASTY, KNEE, TOTAL
Anesthesia: Monitor Anesthesia Care | Site: Knee | Laterality: Left

## 2022-12-15 MED ORDER — PHENYLEPHRINE HCL-NACL 20-0.9 MG/250ML-% IV SOLN
INTRAVENOUS | Status: DC | PRN
  Administered 2022-12-15: 20 ug/min via INTRAVENOUS

## 2022-12-15 MED ORDER — CARVEDILOL 3.125 MG PO TABS: 3.1250 mg | ORAL_TABLET | Freq: Every evening | ORAL | Status: DC

## 2022-12-15 MED ORDER — TRANEXAMIC ACID 1000 MG/10ML IV SOLN
INTRAVENOUS | Status: DC | PRN
  Administered 2022-12-15: 2000 mg via TOPICAL

## 2022-12-15 MED ORDER — ORAL CARE MOUTH RINSE: 15.0000 mL | Freq: Once | OROMUCOSAL | Status: AC

## 2022-12-15 MED ORDER — METOCLOPRAMIDE HCL 5 MG PO TABS: 5.0000 mg | ORAL_TABLET | Freq: Three times a day (TID) | ORAL | Status: DC | PRN

## 2022-12-15 MED ORDER — 0.9 % SODIUM CHLORIDE (POUR BTL) OPTIME
TOPICAL | Status: DC | PRN
  Administered 2022-12-15: 1000 mL

## 2022-12-15 MED ORDER — MIDAZOLAM HCL 2 MG/2ML IJ SOLN
INTRAMUSCULAR | Status: AC
  Filled 2022-12-15: qty 2

## 2022-12-15 MED ORDER — OXYCODONE HCL 5 MG PO TABS
10.0000 mg | ORAL_TABLET | ORAL | Status: DC | PRN
  Filled 2022-12-15: qty 2

## 2022-12-15 MED ORDER — POVIDONE-IODINE 10 % EX SWAB
2.0000 | Freq: Once | CUTANEOUS | Status: AC
  Administered 2022-12-15: 2 via TOPICAL

## 2022-12-15 MED ORDER — VANCOMYCIN HCL 1000 MG IV SOLR
INTRAVENOUS | Status: AC
  Filled 2022-12-15: qty 20

## 2022-12-15 MED ORDER — METOCLOPRAMIDE HCL 5 MG/ML IJ SOLN: 5.0000 mg | Freq: Three times a day (TID) | INTRAMUSCULAR | Status: DC | PRN

## 2022-12-15 MED ORDER — PROPOFOL 500 MG/50ML IV EMUL
INTRAVENOUS | Status: DC | PRN
  Administered 2022-12-15: 20 mg via INTRAVENOUS
  Administered 2022-12-15: 100 ug/kg/min via INTRAVENOUS
  Administered 2022-12-15: 20 mg via INTRAVENOUS

## 2022-12-15 MED ORDER — METHOCARBAMOL 1000 MG/10ML IJ SOLN: 500.0000 mg | Freq: Four times a day (QID) | INTRAVENOUS | Status: DC | PRN

## 2022-12-15 MED ORDER — FENTANYL CITRATE (PF) 250 MCG/5ML IJ SOLN
INTRAMUSCULAR | Status: AC
  Filled 2022-12-15: qty 5

## 2022-12-15 MED ORDER — BUPIVACAINE-MELOXICAM ER 400-12 MG/14ML IJ SOLN
INTRAMUSCULAR | Status: AC
  Filled 2022-12-15: qty 1

## 2022-12-15 MED ORDER — LISINOPRIL 20 MG PO TABS
20.0000 mg | ORAL_TABLET | Freq: Every day | ORAL | Status: DC
  Administered 2022-12-15: 20 mg via ORAL
  Filled 2022-12-15: qty 1

## 2022-12-15 MED ORDER — DEXAMETHASONE SODIUM PHOSPHATE 10 MG/ML IJ SOLN
INTRAMUSCULAR | Status: DC | PRN
  Administered 2022-12-15: 5 mg

## 2022-12-15 MED ORDER — PROMETHAZINE HCL 25 MG/ML IJ SOLN: 6.2500 mg | INTRAMUSCULAR | Status: DC | PRN

## 2022-12-15 MED ORDER — ROPINIROLE HCL 1 MG PO TABS
5.0000 mg | ORAL_TABLET | Freq: Every day | ORAL | Status: DC
  Administered 2022-12-15: 5 mg via ORAL
  Filled 2022-12-15: qty 5

## 2022-12-15 MED ORDER — OXYCODONE HCL 5 MG PO TABS
5.0000 mg | ORAL_TABLET | ORAL | Status: DC | PRN
  Administered 2022-12-15: 5 mg via ORAL
  Administered 2022-12-15 – 2022-12-16 (×3): 10 mg via ORAL
  Filled 2022-12-15 (×2): qty 2
  Filled 2022-12-15: qty 1

## 2022-12-15 MED ORDER — OXYCODONE HCL ER 10 MG PO T12A
10.0000 mg | EXTENDED_RELEASE_TABLET | Freq: Two times a day (BID) | ORAL | Status: DC
  Administered 2022-12-15 (×2): 10 mg via ORAL
  Filled 2022-12-15 (×2): qty 1

## 2022-12-15 MED ORDER — SODIUM CHLORIDE 0.9 % IV SOLN: INTRAVENOUS | Status: DC

## 2022-12-15 MED ORDER — BUPIVACAINE-MELOXICAM ER 400-12 MG/14ML IJ SOLN
INTRAMUSCULAR | Status: DC | PRN
  Administered 2022-12-15: 400 mg

## 2022-12-15 MED ORDER — ROPIVACAINE HCL 7.5 MG/ML IJ SOLN
INTRAMUSCULAR | Status: DC | PRN
  Administered 2022-12-15: 20 mL via PERINEURAL

## 2022-12-15 MED ORDER — TRANEXAMIC ACID-NACL 1000-0.7 MG/100ML-% IV SOLN
1000.0000 mg | Freq: Once | INTRAVENOUS | Status: AC
  Administered 2022-12-15: 1000 mg via INTRAVENOUS
  Filled 2022-12-15: qty 100

## 2022-12-15 MED ORDER — FENTANYL CITRATE (PF) 250 MCG/5ML IJ SOLN
INTRAMUSCULAR | Status: DC | PRN
  Administered 2022-12-15 (×2): 50 ug via INTRAVENOUS
  Administered 2022-12-15: 25 ug via INTRAVENOUS

## 2022-12-15 MED ORDER — LACTATED RINGERS IV SOLN: INTRAVENOUS | Status: DC

## 2022-12-15 MED ORDER — LEVOTHYROXINE SODIUM 88 MCG PO TABS
88.0000 ug | ORAL_TABLET | Freq: Every day | ORAL | Status: DC
  Administered 2022-12-16: 88 ug via ORAL
  Filled 2022-12-15: qty 1

## 2022-12-15 MED ORDER — ACETAMINOPHEN 500 MG PO TABS
1000.0000 mg | ORAL_TABLET | Freq: Once | ORAL | Status: AC
  Administered 2022-12-15: 1000 mg via ORAL
  Filled 2022-12-15: qty 2

## 2022-12-15 MED ORDER — MENTHOL 3 MG MT LOZG: 1.0000 | LOZENGE | OROMUCOSAL | Status: DC | PRN

## 2022-12-15 MED ORDER — FERROUS SULFATE 325 (65 FE) MG PO TABS
325.0000 mg | ORAL_TABLET | Freq: Three times a day (TID) | ORAL | Status: DC
  Administered 2022-12-15 (×2): 325 mg via ORAL
  Filled 2022-12-15 (×3): qty 1

## 2022-12-15 MED ORDER — CEFAZOLIN SODIUM-DEXTROSE 2-4 GM/100ML-% IV SOLN
2.0000 g | INTRAVENOUS | Status: AC
  Administered 2022-12-15: 2 g via INTRAVENOUS
  Filled 2022-12-15: qty 100

## 2022-12-15 MED ORDER — DEXAMETHASONE SODIUM PHOSPHATE 10 MG/ML IJ SOLN
10.0000 mg | Freq: Once | INTRAMUSCULAR | Status: AC
  Administered 2022-12-16: 10 mg via INTRAVENOUS
  Filled 2022-12-15: qty 1

## 2022-12-15 MED ORDER — ALBUMIN HUMAN 5 % IV SOLN: INTRAVENOUS | Status: DC | PRN

## 2022-12-15 MED ORDER — SODIUM CHLORIDE 0.9 % IR SOLN
Status: DC | PRN
  Administered 2022-12-15: 1000 mL

## 2022-12-15 MED ORDER — PHENOL 1.4 % MT LIQD: 1.0000 | OROMUCOSAL | Status: DC | PRN

## 2022-12-15 MED ORDER — CEFAZOLIN SODIUM-DEXTROSE 2-4 GM/100ML-% IV SOLN
2.0000 g | Freq: Four times a day (QID) | INTRAVENOUS | Status: AC
  Administered 2022-12-15 (×2): 2 g via INTRAVENOUS
  Filled 2022-12-15 (×2): qty 100

## 2022-12-15 MED ORDER — TRANEXAMIC ACID-NACL 1000-0.7 MG/100ML-% IV SOLN
1000.0000 mg | INTRAVENOUS | Status: DC
  Filled 2022-12-15: qty 100

## 2022-12-15 MED ORDER — KETOROLAC TROMETHAMINE 15 MG/ML IJ SOLN
7.5000 mg | Freq: Four times a day (QID) | INTRAMUSCULAR | Status: AC
  Administered 2022-12-15 – 2022-12-16 (×4): 7.5 mg via INTRAVENOUS
  Filled 2022-12-15 (×4): qty 1

## 2022-12-15 MED ORDER — ACETAMINOPHEN 325 MG PO TABS: 325.0000 mg | ORAL_TABLET | Freq: Four times a day (QID) | ORAL | Status: DC | PRN

## 2022-12-15 MED ORDER — AMISULPRIDE (ANTIEMETIC) 5 MG/2ML IV SOLN: 10.0000 mg | Freq: Once | INTRAVENOUS | Status: DC | PRN

## 2022-12-15 MED ORDER — ASPIRIN 81 MG PO CHEW
81.0000 mg | CHEWABLE_TABLET | Freq: Two times a day (BID) | ORAL | Status: DC
  Administered 2022-12-15 – 2022-12-16 (×2): 81 mg via ORAL
  Filled 2022-12-15 (×2): qty 1

## 2022-12-15 MED ORDER — ONDANSETRON HCL 4 MG PO TABS: 4.0000 mg | ORAL_TABLET | Freq: Four times a day (QID) | ORAL | Status: DC | PRN

## 2022-12-15 MED ORDER — DOCUSATE SODIUM 100 MG PO CAPS
100.0000 mg | ORAL_CAPSULE | Freq: Two times a day (BID) | ORAL | Status: DC
  Administered 2022-12-15 – 2022-12-16 (×3): 100 mg via ORAL
  Filled 2022-12-15 (×3): qty 1

## 2022-12-15 MED ORDER — DEXAMETHASONE SODIUM PHOSPHATE 10 MG/ML IJ SOLN
INTRAMUSCULAR | Status: DC | PRN
  Administered 2022-12-15: 8 mg via INTRAVENOUS

## 2022-12-15 MED ORDER — ACETAMINOPHEN 500 MG PO TABS
1000.0000 mg | ORAL_TABLET | Freq: Four times a day (QID) | ORAL | Status: AC
  Administered 2022-12-15 – 2022-12-16 (×4): 1000 mg via ORAL
  Filled 2022-12-15 (×4): qty 2

## 2022-12-15 MED ORDER — METHOCARBAMOL 500 MG PO TABS
500.0000 mg | ORAL_TABLET | Freq: Four times a day (QID) | ORAL | Status: DC | PRN
  Administered 2022-12-15 – 2022-12-16 (×3): 500 mg via ORAL
  Filled 2022-12-15 (×3): qty 1

## 2022-12-15 MED ORDER — HYDROMORPHONE HCL 1 MG/ML IJ SOLN
0.5000 mg | INTRAMUSCULAR | Status: DC | PRN
  Administered 2022-12-15: 1 mg via INTRAVENOUS
  Filled 2022-12-15: qty 1

## 2022-12-15 MED ORDER — FENTANYL CITRATE (PF) 100 MCG/2ML IJ SOLN: 25.0000 ug | INTRAMUSCULAR | Status: DC | PRN

## 2022-12-15 MED ORDER — VANCOMYCIN HCL 1000 MG IV SOLR
INTRAVENOUS | Status: DC | PRN
  Administered 2022-12-15: 1000 mg

## 2022-12-15 MED ORDER — ONDANSETRON HCL 4 MG/2ML IJ SOLN: 4.0000 mg | Freq: Four times a day (QID) | INTRAMUSCULAR | Status: DC | PRN

## 2022-12-15 MED ORDER — MIDAZOLAM HCL 2 MG/2ML IJ SOLN
INTRAMUSCULAR | Status: DC | PRN
  Administered 2022-12-15: 2 mg via INTRAVENOUS

## 2022-12-15 MED ORDER — PRONTOSAN WOUND IRRIGATION OPTIME
TOPICAL | Status: DC | PRN
  Administered 2022-12-15: 350 mL

## 2022-12-15 MED ORDER — BUPIVACAINE IN DEXTROSE 0.75-8.25 % IT SOLN
INTRATHECAL | Status: DC | PRN
  Administered 2022-12-15: 1.7 mL via INTRATHECAL

## 2022-12-15 MED ORDER — CHLORHEXIDINE GLUCONATE 0.12 % MT SOLN
15.0000 mL | Freq: Once | OROMUCOSAL | Status: AC
  Administered 2022-12-15: 15 mL via OROMUCOSAL
  Filled 2022-12-15: qty 15

## 2022-12-15 MED ORDER — ONDANSETRON HCL 4 MG/2ML IJ SOLN
INTRAMUSCULAR | Status: DC | PRN
  Administered 2022-12-15: 4 mg via INTRAVENOUS

## 2022-12-15 SURGICAL SUPPLY — 88 items
ADH SKN CLS APL DERMABOND .7 (GAUZE/BANDAGES/DRESSINGS) ×1
ALCOHOL 70% 16 OZ (MISCELLANEOUS) ×1 IMPLANT
AUG MED ASF PS 4-5 C-D 10 (Joint) ×1 IMPLANT
AUGMENT MED ASF PS 4-5 C-D 10 (Joint) IMPLANT
BAG COUNTER SPONGE SURGICOUNT (BAG) IMPLANT
BAG DECANTER FOR FLEXI CONT (MISCELLANEOUS) ×1 IMPLANT
BAG SPNG CNTER NS LX DISP (BAG)
BANDAGE ESMARK 6X9 LF (GAUZE/BANDAGES/DRESSINGS) IMPLANT
BLADE SAG 18X100X1.27 (BLADE) ×1 IMPLANT
BLADE SAW SGTL 13X75X1.27 (BLADE) IMPLANT
BLADE SAW SGTL 73X25 THK (BLADE) ×1 IMPLANT
BNDG CMPR 5X4 CHSV STRCH STRL (GAUZE/BANDAGES/DRESSINGS) ×1
BNDG CMPR 9X6 STRL LF SNTH (GAUZE/BANDAGES/DRESSINGS)
BNDG COHESIVE 4X5 TAN STRL LF (GAUZE/BANDAGES/DRESSINGS) IMPLANT
BNDG ESMARK 6X9 LF (GAUZE/BANDAGES/DRESSINGS)
BOWL SMART MIX CTS (DISPOSABLE) ×1 IMPLANT
BSPLAT TIB 5D D CMNT STM LT (Knees) ×1 IMPLANT
CEMENT BONE REFOBACIN R1X40 US (Cement) IMPLANT
CLSR STERI-STRIP ANTIMIC 1/2X4 (GAUZE/BANDAGES/DRESSINGS) ×2 IMPLANT
COMP FEM CMT PS STD 5 LT (Joint) ×1 IMPLANT
COMPONENT FEM CMT PS STD 5 LT (Joint) IMPLANT
COOLER ICEMAN CLASSIC (MISCELLANEOUS) ×1 IMPLANT
COVER SURGICAL LIGHT HANDLE (MISCELLANEOUS) ×1 IMPLANT
CUFF TOURN SGL QUICK 34 (TOURNIQUET CUFF) ×1
CUFF TOURN SGL QUICK 42 (TOURNIQUET CUFF) IMPLANT
CUFF TRNQT CYL 34X4.125X (TOURNIQUET CUFF) ×1 IMPLANT
DERMABOND ADVANCED .7 DNX12 (GAUZE/BANDAGES/DRESSINGS) ×1 IMPLANT
DRAPE EXTREMITY T 121X128X90 (DISPOSABLE) ×1 IMPLANT
DRAPE HALF SHEET 40X57 (DRAPES) ×1 IMPLANT
DRAPE INCISE IOBAN 66X45 STRL (DRAPES) ×1 IMPLANT
DRAPE ORTHO SPLIT 77X108 STRL (DRAPES)
DRAPE POUCH INSTRU U-SHP 10X18 (DRAPES) ×1 IMPLANT
DRAPE SURG ORHT 6 SPLT 77X108 (DRAPES) IMPLANT
DRAPE U-SHAPE 47X51 STRL (DRAPES) ×2 IMPLANT
DRSG AQUACEL AG ADV 3.5X10 (GAUZE/BANDAGES/DRESSINGS) ×1 IMPLANT
DURAPREP 26ML APPLICATOR (WOUND CARE) ×3 IMPLANT
ELECT CAUTERY BLADE 6.4 (BLADE) ×1 IMPLANT
ELECT PENCIL ROCKER SW 15FT (MISCELLANEOUS) ×1 IMPLANT
ELECT REM PT RETURN 9FT ADLT (ELECTROSURGICAL) ×1
ELECTRODE REM PT RTRN 9FT ADLT (ELECTROSURGICAL) ×1 IMPLANT
GLOVE BIOGEL PI IND STRL 7.0 (GLOVE) ×2 IMPLANT
GLOVE BIOGEL PI IND STRL 7.5 (GLOVE) ×5 IMPLANT
GLOVE ECLIPSE 7.0 STRL STRAW (GLOVE) ×3 IMPLANT
GLOVE INDICATOR 7.0 STRL GRN (GLOVE) ×1 IMPLANT
GLOVE INDICATOR 7.5 STRL GRN (GLOVE) ×1 IMPLANT
GLOVE SURG SYN 7.5 E (GLOVE) ×2 IMPLANT
GLOVE SURG SYN 7.5 PF PI (GLOVE) ×2 IMPLANT
GLOVE SURG UNDER LTX SZ7.5 (GLOVE) ×2 IMPLANT
GLOVE SURG UNDER POLY LF SZ7 (GLOVE) ×2 IMPLANT
GOWN STRL REUS W/ TWL LRG LVL3 (GOWN DISPOSABLE) ×1 IMPLANT
GOWN STRL REUS W/TWL LRG LVL3 (GOWN DISPOSABLE) ×1
GOWN STRL SURGICAL XL XLNG (GOWN DISPOSABLE) ×1 IMPLANT
GOWN TOGA ZIPPER T7+ PEEL AWAY (MISCELLANEOUS) ×2 IMPLANT
HANDPIECE INTERPULSE COAX TIP (DISPOSABLE) ×1
HOOD PEEL AWAY T7 (MISCELLANEOUS) ×1 IMPLANT
KIT BASIN OR (CUSTOM PROCEDURE TRAY) ×1 IMPLANT
KIT TURNOVER KIT B (KITS) ×1 IMPLANT
MANIFOLD NEPTUNE II (INSTRUMENTS) ×1 IMPLANT
MARKER SKIN DUAL TIP RULER LAB (MISCELLANEOUS) ×2 IMPLANT
NDL SPNL 18GX3.5 QUINCKE PK (NEEDLE) ×1 IMPLANT
NEEDLE SPNL 18GX3.5 QUINCKE PK (NEEDLE) ×1 IMPLANT
NS IRRIG 1000ML POUR BTL (IV SOLUTION) ×1 IMPLANT
PACK TOTAL JOINT (CUSTOM PROCEDURE TRAY) ×1 IMPLANT
PAD ARMBOARD 7.5X6 YLW CONV (MISCELLANEOUS) ×2 IMPLANT
PAD COLD SHLDR WRAP-ON (PAD) ×1 IMPLANT
PIN DRILL HDLS TROCAR 75 4PK (PIN) IMPLANT
SCREW FEMALE HEX FIX 25X2.5 (ORTHOPEDIC DISPOSABLE SUPPLIES) IMPLANT
SET HNDPC FAN SPRY TIP SCT (DISPOSABLE) ×1 IMPLANT
SOLUTION PRONTOSAN WOUND 350ML (IRRIGATION / IRRIGATOR) ×1 IMPLANT
STAPLER VISISTAT 35W (STAPLE) IMPLANT
STEM POLY PAT PLY 29M KNEE (Knees) IMPLANT
STEM TIBIA 5 DEG SZ D L KNEE (Knees) IMPLANT
SUCTION TUBE FRAZIER 10FR DISP (SUCTIONS) ×1 IMPLANT
SUT ETHILON 2 0 FS 18 (SUTURE) IMPLANT
SUT MNCRL AB 3-0 PS2 27 (SUTURE) IMPLANT
SUT VIC AB 0 CT1 27 (SUTURE) ×2
SUT VIC AB 0 CT1 27XBRD ANBCTR (SUTURE) ×2 IMPLANT
SUT VIC AB 1 CTX 27 (SUTURE) ×3 IMPLANT
SUT VIC AB 2-0 CT1 27 (SUTURE) ×5
SUT VIC AB 2-0 CT1 TAPERPNT 27 (SUTURE) ×4 IMPLANT
SYR 50ML LL SCALE MARK (SYRINGE) ×2 IMPLANT
TIBIA STEM 5 DEG SZ D L KNEE (Knees) ×1 IMPLANT
TOWEL GREEN STERILE (TOWEL DISPOSABLE) ×1 IMPLANT
TOWEL GREEN STERILE FF (TOWEL DISPOSABLE) ×1 IMPLANT
TRAY CATH INTERMITTENT SS 16FR (CATHETERS) IMPLANT
TUBE SUCT ARGYLE STRL (TUBING) ×1 IMPLANT
UNDERPAD 30X36 HEAVY ABSORB (UNDERPADS AND DIAPERS) ×1 IMPLANT
YANKAUER SUCT BULB TIP NO VENT (SUCTIONS) ×2 IMPLANT

## 2022-12-15 NOTE — Op Note (Signed)
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 CPT 229 [id number]  Surgeon: N. Glee Arvin, MD  Assist: Oneal Grout, PA-C; necessary for the timely completion of procedure and due to complexity of procedure.  Anesthesia: Spinal, regional, local  Tourniquet time: see anesthesia record  Implants used: Zimmer persona cemented Femur: CR 5 Tibia: D Patella: 29 mm Polyethylene: 10 mm medial congruent  Indication: Sabrina Faulkner is a 67 y.o. year old female with a history of knee pain. Having failed conservative management, the patient elected to proceed with a total knee arthroplasty.  We have reviewed the risk and benefits of the surgery and they elected to proceed after voicing understanding.  Procedure:  After informed consent was obtained and understanding of the risk were voiced including but not limited to bleeding, infection, damage to surrounding structures including nerves and vessels, blood clots, leg length inequality and the failure to achieve desired results, the operative extremity was marked with verbal confirmation of the patient in the holding area.   The patient was then brought to the operating room and transported to the operating room table in the supine position.  A tourniquet was applied to the operative extremity around the upper thigh. The operative limb was then prepped and draped in the usual sterile fashion and preoperative antibiotics were administered.  A time out was performed prior to the start of surgery confirming the correct extremity, preoperative antibiotic administration, as well as team members, implants and instruments available for the case. Correct surgical site was also confirmed with preoperative radiographs. The limb was then elevated for  exsanguination and the tourniquet was inflated. A midline incision was made and a standard medial parapatellar approach was performed.  The infrapatellar fat pad was removed.  Suprapatellar synovium was removed to reveal the anterior distal femoral cortex.  A medial peel was performed to release the capsule of the medial tibial plateau.  The patella was then everted and was prepared and sized to a 29 mm.  A cover was placed on the patella for protection from retractors.  The knee was then brought into full flexion and we then turned our attention to the femur.  The cruciates were sacrificed.  Start site was drilled in the femur and the intramedullary distal femoral cutting guide was placed, set at 5 degrees valgus, taking 10 mm of distal resection. The distal cut was made. Osteophytes were then removed.  Next, the proximal tibial cutting guide was placed with appropriate slope, varus/valgus alignment and depth of resection. The proximal tibial cut was made taking 4 mm off the low side. Gap blocks were then used to assess the extension gap and alignment, and appropriate soft tissue releases were performed. Attention was turned back to the femur, which was sized using the sizing guide to a size 5. Appropriate rotation of the femoral component was determined using epicondylar axis, Whiteside's line, and assessing the flexion gap under ligament tension. The appropriate size 4-in-1 cutting block was placed and checked with an angel wing and cuts were made. Posterior femoral osteophytes and uncapped bone were then removed with the curved osteotome.  Trial components were placed, and stability was checked in full extension, mid-flexion, and deep flexion. Proper tibial rotation was determined and marked.  The patella tracked well without a lateral release.  The femoral lugs were then drilled. Trial components were then  removed and tibial preparation performed.  The tibia was sized for a size D component.   The bony  surfaces were irrigated with a pulse lavage and then dried. Bone cement was vacuum mixed on the back table, and the final components sized above were cemented into place.  Antibiotic irrigation was placed in the knee joint and soft tissues while the cement cured.  After cement had finished curing, excess cement was removed. The stability of the construct was re-evaluated throughout a range of motion and found to be acceptable. The trial liner was removed, the knee was copiously irrigated, and the knee was re-evaluated for any excess bone debris. The real polyethylene liner, 10 mm thick, was inserted and checked to ensure the locking mechanism had engaged appropriately. The tourniquet was deflated and hemostasis was achieved. The wound was irrigated with normal saline.  One gram of vancomycin powder was placed in the surgical bed.  Topical 0.25% bupivacaine and meloxicam was placed in the joint for postoperative pain.  Capsular closure was performed with a #1 vicryl, subcutaneous fat closed with a 0 vicryl suture, then subcutaneous tissue closed with interrupted 2.0 vicryl suture. The skin was then closed with a 2.0 nylon and dermabond. A sterile dressing was applied.  The patient was awakened in the operating room and taken to recovery in stable condition. All sponge, needle, and instrument counts were correct at the end of the case.  Tessa Lerner was necessary for opening, closing, retracting, limb positioning and overall facilitation and completion of the surgery.  Position: supine  Complications: none.  Time Out: performed   Drains/Packing: none  Estimated blood loss: minimal  Returned to Recovery Room: in good condition.   Antibiotics: yes   Mechanical VTE (DVT) Prophylaxis: sequential compression devices, TED thigh-high  Chemical VTE (DVT) Prophylaxis: aspirin  Fluid Replacement  Crystalloid: see anesthesia record Blood: none  FFP: none   Specimens Removed: 1 to pathology   Sponge  and Instrument Count Correct? yes   PACU: portable radiograph - knee AP and Lateral   Plan/RTC: Return in 2 weeks for wound check.   Weight Bearing/Load Lower Extremity: full   Implant Name Type Inv. Item Serial No. Manufacturer Lot No. LRB No. Used Action  CEMENT BONE REFOBACIN R1X40 [place] - [id number] Cement CEMENT BONE REFOBACIN R1X40 [place]  ZIMMER RECON(ORTH,TRAU,BIO,SG) [id number] Left 1 Implanted  CEMENT BONE REFOBACIN R1X40 [place] - [id number] Cement CEMENT BONE REFOBACIN R1X40 [place]  ZIMMER RECON(ORTH,TRAU,BIO,SG) [id number] Left 1 Implanted  TIBIA STEM 5 DEG SZ D L KNEE - [id number] Knees TIBIA STEM 5 DEG SZ D L KNEE  ZIMMER RECON(ORTH,TRAU,BIO,SG) [id number] Left 1 Implanted  COMP FEM CMT PS STD 5 LT - [id number] Joint COMP FEM CMT PS STD 5 LT  ZIMMER RECON(ORTH,TRAU,BIO,SG) [id number] Left 1 Implanted  AUG MED ASF PS 4-5 C-D 10 - [id number] Joint AUG MED ASF PS 4-5 C-D 10  ZIMMER RECON(ORTH,TRAU,BIO,SG) [id number] Left 1 Implanted  STEM POLY PAT PLY 42M KNEE - [id number] Knees STEM POLY PAT PLY 42M KNEE  ZIMMER RECON(ORTH,TRAU,BIO,SG) [id number] Left 1 Implanted    N. Glee Arvin, MD [facility] [address] 8:54 AM

## 2022-12-15 NOTE — Evaluation (Signed)
Physical Therapy Evaluation Patient Details Name: Sabrina Faulkner MRN: [id number] DOB: 1955-10-31 Today's Date: 12/15/2022  History of Present Illness  67 y.o. female presents to [facility] hospital on 12/15/2022 for elective L TKA. PMH includes DOE, fibromyalgia, GERD, HA, hepatic cirrhosis, HTN, OSA.  Clinical Impression  Pt presents to PT with deficits in strength, power, ROM, gait, balance, endurance. Pt is mobilizing well s/p TKA, ambulating with support of RW for household distances. PT provides education on TKA exercise packet for pt to begin tomorrow. Pt will benefit from further gait and stair training tomorrow.       Recommendations for follow up therapy are one component of a multi-disciplinary discharge planning process, led by the attending physician.  Recommendations may be updated based on patient status, additional functional criteria and insurance authorization.  Follow Up Recommendations       Assistance Recommended at Discharge PRN  Patient can return home with the following  A little help with bathing/dressing/bathroom;Assistance with cooking/housework;Assist for transportation;Help with stairs or ramp for entrance    Equipment Recommendations None recommended by PT  Recommendations for Other Services       Functional Status Assessment Patient has had a recent decline in their functional status and demonstrates the ability to make significant improvements in function in a reasonable and predictable amount of time.     Precautions / Restrictions Precautions Precautions: Knee;Fall Precaution Booklet Issued: Yes (comment) Restrictions Weight Bearing Restrictions: Yes LLE Weight Bearing: Weight bearing as tolerated      Mobility  Bed Mobility Overal bed mobility: Modified Independent             General bed mobility comments: HOB elevated    Transfers Overall transfer level: Needs assistance Equipment used: Rolling walker (2 wheels) Transfers: Sit to/from  Stand Sit to Stand: Min guard           General transfer comment: verbal cues for hand placement    Ambulation/Gait Ambulation/Gait assistance: Supervision Gait Distance (Feet): 100 Feet Assistive device: Rolling walker (2 wheels) Gait Pattern/deviations: Step-through pattern Gait velocity: reduced Gait velocity interpretation: <1.8 ft/sec, indicate of risk for recurrent falls   General Gait Details: slowed step-through gait, reduced stance time on LLE  Stairs            Wheelchair Mobility    Modified Rankin (Stroke Patients Only)       Balance Overall balance assessment: Needs assistance Sitting-balance support: No upper extremity supported, Feet supported Sitting balance-Leahy Scale: Good     Standing balance support: Single extremity supported, Bilateral upper extremity supported, Reliant on assistive device for balance Standing balance-Leahy Scale: Poor                               Pertinent Vitals/Pain Pain Assessment Pain Assessment: No/denies pain    Home Living Family/patient expects to be discharged to:: Private residence Living Arrangements: Spouse/significant other Available Help at Discharge: Family;Available 24 hours/day Type of Home: House Home Access: Stairs to enter Entrance Stairs-Rails: None Entrance Stairs-Number of Steps: 1   Home Layout: One level Home Equipment: Agricultural consultant (2 wheels);Cane - single point      Prior Function Prior Level of Function : Independent/Modified Independent;Driving                     Hand Dominance        Extremity/Trunk Assessment   Upper Extremity Assessment Upper Extremity Assessment: Overall WFL for  tasks assessed    Lower Extremity Assessment Lower Extremity Assessment: LLE deficits/detail LLE Deficits / Details: generalized post-op weakness and ROM restrictions as expected s/p TKA    Cervical / Trunk Assessment Cervical / Trunk Assessment: Normal   Communication   Communication: No difficulties  Cognition Arousal/Alertness: Awake/alert Behavior During Therapy: WFL for tasks assessed/performed Overall Cognitive Status: Within Functional Limits for tasks assessed                                          General Comments General comments (skin integrity, edema, etc.): VSS on RA    Exercises     Assessment/Plan    PT Assessment Patient needs continued PT services  PT Problem List Decreased strength;Decreased range of motion;Decreased activity tolerance;Decreased balance;Decreased mobility;Decreased knowledge of use of DME       PT Treatment Interventions DME instruction;Gait training;Stair training;Functional mobility training;Balance training;Therapeutic activities;Therapeutic exercise;Patient/family education    PT Goals (Current goals can be found in the Care Plan section)  Acute Rehab PT Goals Patient Stated Goal: to go home PT Goal Formulation: With patient Time For Goal Achievement: 12/19/22 Potential to Achieve Goals: Good    Frequency 7X/week     Co-evaluation               AM-PAC PT "6 Clicks" Mobility  Outcome Measure Help needed turning from your back to your side while in a flat bed without using bedrails?: None Help needed moving from lying on your back to sitting on the side of a flat bed without using bedrails?: None Help needed moving to and from a bed to a chair (including a wheelchair)?: A Little Help needed standing up from a chair using your arms (e.g., wheelchair or bedside chair)?: A Little Help needed to walk in hospital room?: A Little Help needed climbing 3-5 steps with a railing? : A Little 6 Click Score: 20    End of Session   Activity Tolerance: Patient tolerated treatment well Patient left: in bed;with call bell/phone within reach Nurse Communication: Mobility status PT Visit Diagnosis: Other abnormalities of gait and mobility (R26.89);Muscle weakness  (generalized) (M62.81)    Time: 4098-1191 PT Time Calculation (min) (ACUTE ONLY): 17 min   Charges:   PT Evaluation $PT Eval Low Complexity: 1 Low          Arlyss Gandy, PT, DPT Acute Rehabilitation Office [phone]   Arlyss Gandy 12/15/2022, 3:51 PM

## 2022-12-15 NOTE — Anesthesia Procedure Notes (Signed)
Anesthesia Regional Block: Adductor canal block   Pre-Anesthetic Checklist: , timeout performed,  Correct Patient, Correct Site, Correct Laterality,  Correct Procedure, Correct Position, site marked,  Risks and benefits discussed,  Surgical consent,  Pre-op evaluation,  At surgeon's request and post-op pain management  Laterality: Left  Prep: chloraprep       Needles:  Injection technique: Single-shot  Needle Type: Stimulator Needle - 80     Needle Length: 10cm  Needle Gauge: 21     Additional Needles:   Narrative:  Start time: 12/15/2022 6:43 AM End time: 12/15/2022 6:53 AM Injection made incrementally with aspirations every 5 mL.  Performed by: Personally  Anesthesiologist: Heather Roberts, MD

## 2022-12-15 NOTE — Anesthesia Postprocedure Evaluation (Signed)
Anesthesia Post Note  Patient: Sabrina Faulkner  Procedure(s) Performed: LEFT TOTAL KNEE ARTHROPLASTY (Left: Knee)     Patient location during evaluation: PACU Anesthesia Type: Spinal and MAC Level of consciousness: awake and alert Pain management: pain level controlled Vital Signs Assessment: post-procedure vital signs reviewed and stable Respiratory status: spontaneous breathing and respiratory function stable Cardiovascular status: blood pressure returned to baseline and stable Postop Assessment: spinal receding Anesthetic complications: no  No notable events documented.  Last Vitals:  Vitals:   12/15/22 1015 12/15/22 1042  BP: (!) 106/53 120/62  Pulse: 82 85  Resp: 16 18  Temp: (!) 36.3 C 36.6 C  SpO2: 95% 95%    Last Pain:  Vitals:   12/15/22 1042  TempSrc: Oral  PainSc:                  Trestan Vahle DANIEL

## 2022-12-15 NOTE — Anesthesia Procedure Notes (Signed)
Spinal  Patient location during procedure: OR Start time: 12/15/2022 7:10 AM End time: 12/15/2022 7:20 AM Reason for block: surgical anesthesia Staffing Performed: anesthesiologist  Anesthesiologist: Heather Roberts, MD Performed by: Heather Roberts, MD Authorized by: Heather Roberts, MD   Preanesthetic Checklist Completed: patient identified, IV checked, risks and benefits discussed, surgical consent, monitors and equipment checked, pre-op evaluation and timeout performed Spinal Block Patient position: sitting Prep: DuraPrep Patient monitoring: cardiac monitor, continuous pulse ox and blood pressure Approach: midline Location: L2-3 Injection technique: single-shot Needle Needle type: Pencan  Needle gauge: 24 G Needle length: 9 cm Assessment Events: CSF return Additional Notes Functioning IV was confirmed and monitors were applied. Sterile prep and drape, including hand hygiene and sterile gloves were used. The patient was positioned and the spine was prepped. The skin was anesthetized with lidocaine.  Free flow of clear CSF was obtained prior to injecting local anesthetic into the CSF.  The spinal needle aspirated freely following injection.  The needle was carefully withdrawn.  The patient tolerated the procedure well.

## 2022-12-15 NOTE — Discharge Instructions (Signed)

## 2022-12-15 NOTE — H&P (Signed)
PREOPERATIVE H&P  Chief Complaint: left knee osteoarthritis  HPI: Sabrina Faulkner is a 67 y.o. female who presents for surgical treatment of left knee osteoarthritis.  She denies any changes in medical history.  Past Medical History:  Diagnosis Date   Ambulates with cane    straight cane   Anemia    Arthritis    Dyspnea    ocasional SOB   Esophageal varices (HCC)    Fibromyalgia    GERD (gastroesophageal reflux disease)    occassionally   Headache    no current problems as of 12/04/22   Hearing loss    bilateral - no hearing aids   Heart murmur    NEVER HAS CAUSED ANY PROBLEMS   Hepatic cirrhosis (HCC)    History of blood transfusion    x 1   Hypercholesteremia    Hypertension    Hypothyroidism    Sleep apnea    does not wear CPAP   Past Surgical History:  Procedure Laterality Date   ABDOMINAL HYSTERECTOMY     ANTERIOR CERVICAL DECOMP/DISCECTOMY FUSION N/A 12/23/2016   Procedure: Cervical Five-Six, Cervical Six-Seven Anterior discectomy with fusion and plate fixation;  Surgeon: Ditty, Loura Halt, MD;  Location: [facility] OR;  Service: Neurosurgery;  Laterality: N/A;  C5-6 C6-7 Anterior discectomy with fusion and plate fixation   arthroscopic knee Right    bladder stimulatulation insertion     has remote - will bring on DOS 12/04/22   CARPAL TUNNEL RELEASE Right    COLONOSCOPY     INCONTINENCE SURGERY     bladder sling   livery biopsy     TONSILLECTOMY     ttrigger finger Right    UPPER ENDOSCOPY W/ BANDING     x several   WRIST FRACTURE SURGERY Right    Social History   Socioeconomic History   Marital status: Married    Spouse name: Not on file   Number of children: Not on file   Years of education: Not on file   Highest education level: Not on file  Occupational History   Not on file  Tobacco Use   Smoking status: Never   Smokeless tobacco: Never  Vaping Use   Vaping Use: Never used  Substance and Sexual Activity   Alcohol use: No   Drug use: No    Sexual activity: Yes    Birth control/protection: Surgical    Comment: Hysterectomy  Other Topics Concern   Not on file  Social History Narrative   Not on file   Social Determinants of Health   Financial Resource Strain: Not on file  Food Insecurity: Not on file  Transportation Needs: Not on file  Physical Activity: Not on file  Stress: Not on file  Social Connections: Not on file   No family history on file. Allergies  Allergen Reactions   Cephalexin Diarrhea and Other (See Comments)    C DIFF   Penicillins Rash     PATIENT HAD A PCN REACTION WITH IMMEDIATE RASH, FACIAL/TONGUE/THROAT SWELLING, SOB, OR LIGHTHEADEDNESS WITH HYPOTENSION:  #  #  #  YES  #  #  #  Has patient had a PCN reaction causing severe rash involving mucus membranes or skin necrosis: Unknown Has patient had a PCN reaction that required hospitalization: Unknown Has patient had a PCN reaction occurring within the last 10 years: No If all of the above answers are "NO", then may proceed with Cephalosporin use.    Prior to Admission medications  Medication Sig Start Date End Date Taking? Authorizing Provider  aspirin EC 81 MG tablet Take 1 tablet (81 mg total) by mouth 2 (two) times daily as needed. To be taken after surgery to prevent blood clots 12/10/22 12/10/23  Cristie Hem, PA-C  atorvastatin (LIPITOR) 20 MG tablet Take 10 mg by mouth daily.   Yes [provider]  calcitRIOL (ROCALTROL) 0.5 MCG capsule Take 0.5 mcg by mouth daily.   Yes [provider]  carvedilol (COREG) 3.125 MG tablet Take 3.125 mg by mouth every evening. 11/14/19  Yes [provider]  docusate sodium (COLACE) 100 MG capsule Take 1 capsule (100 mg total) by mouth daily as needed. 12/10/22 12/10/23  Cristie Hem, PA-C  DULoxetine (CYMBALTA) 30 MG capsule Take 30 mg by mouth every evening.   Yes [provider]  estradiol (ESTRACE) 0.1 MG/GM vaginal cream Place 1 Applicatorful vaginally every other  day. 11/12/22 04/05/24 Yes [provider]  furosemide (LASIX) 20 MG tablet Take 20 mg by mouth daily as needed for fluid. 10/16/18  Yes [provider]  levothyroxine (SYNTHROID, LEVOTHROID) 88 MCG tablet Take 88 mcg by mouth daily before breakfast.   Yes [provider]  lisinopril (ZESTRIL) 20 MG tablet Take 20 mg by mouth daily. 11/17/22  Yes [provider]  methocarbamol (ROBAXIN-750) 750 MG tablet Take 1 tablet (750 mg total) by mouth 2 (two) times daily as needed for muscle spasms. 12/10/22   Cristie Hem, PA-C  NON FORMULARY Pt uses a cpap Patient not taking: Reported on 12/04/2022   Yes [provider]  ondansetron (ZOFRAN) 4 MG tablet Take 1 tablet (4 mg total) by mouth every 8 (eight) hours as needed for nausea or vomiting. 12/10/22   Cristie Hem, PA-C  oxyCODONE-acetaminophen (PERCOCET) 5-325 MG tablet Take 1-2 tablets by mouth every 6 (six) hours as needed. To be taken after surgery 12/10/22   Cristie Hem, PA-C  pantoprazole (PROTONIX) 40 MG tablet Take 40 mg by mouth daily. 10/27/22  Yes [provider]  potassium chloride SA (KLOR-CON M) 20 MEQ tablet Take 20 mEq by mouth daily.   Yes [provider]  ropinirole (REQUIP) 5 MG tablet Take 5 mg by mouth at bedtime.   Yes [provider]  tiZANidine (ZANAFLEX) 2 MG tablet Take 2 mg by mouth every 8 (eight) hours as needed for muscle spasms.   Yes [provider]  traMADol (ULTRAM) 50 MG tablet Take 1 tablet (50 mg total) by mouth 2 (two) times daily as needed. 11/06/22  Yes Cristie Hem, PA-C  gabapentin (NEURONTIN) 300 MG capsule Take 1 capsule (300 mg total) by mouth 3 (three) times daily. Patient not taking: Reported on 12/03/2022 12/24/16   Ditty, Loura Halt, MD  methocarbamol (ROBAXIN) 750 MG tablet Take 1 tablet (750 mg total) by mouth every 6 (six) hours as needed for muscle spasms. 12/24/16   Ditty, Loura Halt, MD     Positive ROS: All  other systems have been reviewed and were otherwise negative with the exception of those mentioned in the HPI and as above.  Physical Exam: General: Alert, no acute distress Cardiovascular: No pedal edema Respiratory: No cyanosis, no use of accessory musculature GI: abdomen soft Skin: No lesions in the area of chief complaint Neurologic: Sensation intact distally Psychiatric: Patient is competent for consent with normal mood and affect Lymphatic: no lymphedema  MUSCULOSKELETAL: exam stable  Assessment: left knee osteoarthritis  Plan: Plan for Procedure(s):  LEFT TOTAL KNEE ARTHROPLASTY  The risks benefits and alternatives were discussed with the patient including but not limited to the risks of nonoperative treatment, versus surgical intervention including infection, bleeding, nerve injury,  blood clots, cardiopulmonary complications, morbidity, mortality, among others, and they were willing to proceed.   Glee Arvin, MD 12/15/2022 5:59 AM

## 2022-12-15 NOTE — Transfer of Care (Signed)
Immediate Anesthesia Transfer of Care Note  Patient: Sabrina Faulkner  Procedure(s) Performed: LEFT TOTAL KNEE ARTHROPLASTY (Left: Knee)  Patient Location: PACU  Anesthesia Type:MAC and Spinal  Level of Consciousness: awake  Airway & Oxygen Therapy: Patient Spontanous Breathing  Post-op Assessment: Report given to RN and Post -op Vital signs reviewed and stable  Post vital signs: Reviewed and stable  Last Vitals:  Vitals Value Taken Time  BP 101/46 12/15/22 0925  Temp    Pulse 89 12/15/22 0927  Resp 19 12/15/22 0927  SpO2 93 % 12/15/22 0927  Vitals shown include unvalidated device data.  Last Pain:  Vitals:   12/15/22 0617  TempSrc:   PainSc: 6       Patients Stated Pain Goal: 1 (12/15/22 0617)  Complications: No notable events documented.

## 2022-12-16 ENCOUNTER — Other Ambulatory Visit: Payer: Self-pay | Admitting: Physician Assistant

## 2022-12-16 DIAGNOSIS — M1712 Unilateral primary osteoarthritis, left knee: Secondary | ICD-10-CM | POA: Diagnosis not present

## 2022-12-16 LAB — CBC
HCT: 37.8 % (ref 36.0–46.0)
Hemoglobin: 12.7 g/dL (ref 12.0–15.0)
MCH: 31.3 pg (ref 26.0–34.0)
MCHC: 33.6 g/dL (ref 30.0–36.0)
MCV: 93.1 fL (ref 80.0–100.0)
Platelets: 70 10*3/uL — ABNORMAL LOW (ref 150–400)
RBC: 4.06 MIL/uL (ref 3.87–5.11)
RDW: 13.6 % (ref 11.5–15.5)
WBC: 12.1 10*3/uL — ABNORMAL HIGH (ref 4.0–10.5)
nRBC: 0 % (ref 0.0–0.2)

## 2022-12-16 MED ORDER — HYDROCODONE-ACETAMINOPHEN 5-325 MG PO TABS: 1.0000 | ORAL_TABLET | Freq: Three times a day (TID) | ORAL | 0 refills | Status: DC | PRN

## 2022-12-16 MED ORDER — HYDROCODONE-ACETAMINOPHEN 5-325 MG PO TABS
1.0000 | ORAL_TABLET | Freq: Four times a day (QID) | ORAL | Status: DC | PRN
  Administered 2022-12-16: 2 via ORAL
  Filled 2022-12-16: qty 2

## 2022-12-16 MED ORDER — ASPIRIN 81 MG PO TBEC: 81.0000 mg | DELAYED_RELEASE_TABLET | Freq: Two times a day (BID) | ORAL | 0 refills | Status: DC

## 2022-12-16 MED ORDER — FLUCONAZOLE 150 MG PO TABS: ORAL_TABLET | ORAL | 0 refills | Status: DC

## 2022-12-16 MED ORDER — DIPHENHYDRAMINE HCL 25 MG PO CAPS
25.0000 mg | ORAL_CAPSULE | Freq: Four times a day (QID) | ORAL | Status: DC | PRN
  Administered 2022-12-16: 25 mg via ORAL
  Filled 2022-12-16: qty 1

## 2022-12-16 NOTE — Progress Notes (Signed)
Patient alert and oriented, mae's well, voiding adequate amount of urine, swallowing without difficulty, no c/o pain at time of discharge. Patient discharged home with family. Script and discharged instructions given to patient. Patient and family stated understanding of instructions given. Patient has an appointment with Dr. Xu in 2 weeks ?

## 2022-12-16 NOTE — Progress Notes (Addendum)
Physical Therapy Treatment Patient Details Name: Sabrina Faulkner MRN: [id number] DOB: 20-Aug-1955 Today's Date: 12/16/2022   History of Present Illness 67 y.o. female presents to [facility] hospital on 12/15/2022 for elective L TKA. PMH includes DOE, fibromyalgia, GERD, HA, hepatic cirrhosis, HTN, OSA.    PT Comments    Patient progressed towards goals per plan of care. Educated patient and family on exercise program for lower extremities with good return demonstration. Progressed ambulation distance without additional reports of pain. Progressed to stair training; educated patient and spouse on proper technique with ascending and descending. Discussed CPM schedule with patient and spouse; advised to follow discharge instructions per physician. End of session included car transfers and recall on exercise progression from handout. Patient able to achieve 106 of active knee flexion without additional pain. Recommending discharge to home with family support as needed.    Recommendations for follow up therapy are one component of a multi-disciplinary discharge planning process, led by the attending physician.  Recommendations may be updated based on patient status, additional functional criteria and insurance authorization.  Follow Up Recommendations       Assistance Recommended at Discharge PRN  Patient can return home with the following A little help with bathing/dressing/bathroom;Assistance with cooking/housework;Assist for transportation;Help with stairs or ramp for entrance   Equipment Recommendations  BSC/3in1    Recommendations for Other Services       Precautions / Restrictions Precautions Precautions: Knee;Fall Precaution Booklet Issued: Yes (comment) Restrictions Weight Bearing Restrictions: Yes LLE Weight Bearing: Weight bearing as tolerated     Mobility  Bed Mobility Overal bed mobility: Needs Assistance Bed Mobility: Supine to Sit, Sit to Supine     Supine to sit: Modified  independent (Device/Increase time) Sit to supine: Modified independent (Device/Increase time)   General bed mobility comments: HOB elevated; no physical assistance provide    Transfers Overall transfer level: Needs assistance Equipment used: Rolling walker (2 wheels) Transfers: Sit to/from Stand Sit to Stand: Supervision           General transfer comment: Patient able to recall verbal cues provided from previous session; performed without physical assistance with increased time    Ambulation/Gait Ambulation/Gait assistance: Supervision Gait Distance (Feet): 200 Feet Assistive device: Rolling walker (2 wheels) Gait Pattern/deviations: Step-through pattern Gait velocity: Decreased     General Gait Details: slowed step-through gait, reduced stance time on LLE   Stairs Stairs: Yes Stairs assistance: Min guard Stair Management: One rail Left, Step to pattern, Forwards, Two rails Number of Stairs: 2 General stair comments: Performed without physical assistance; provided verbal cues for ascending with RLE and descending with LLE. Presented with good return demonstration and control.   Wheelchair Mobility    Modified Rankin (Stroke Patients Only)       Balance Overall balance assessment: Needs assistance Sitting-balance support: No upper extremity supported, Feet supported Sitting balance-Leahy Scale: Good     Standing balance support: Bilateral upper extremity supported, During functional activity, Reliant on assistive device for balance Standing balance-Leahy Scale: Fair                              Cognition Arousal/Alertness: Awake/alert Behavior During Therapy: WFL for tasks assessed/performed Overall Cognitive Status: Within Functional Limits for tasks assessed  Exercises Total Joint Exercises Goniometric ROM: Active 106 LLE in Seated General Exercises - Lower Extremity Ankle  Circles/Pumps: AROM, Both, 10 reps, Supine Quad Sets: AROM, Strengthening, Both, 10 reps, Supine Short Arc Quad: AROM, Supine, Other reps (comment), Both (8) Long Arc Quad: AROM, Both, Other reps (comment), Seated (8) Heel Slides: AROM, Both, Other reps (comment), Supine (8) Hip ABduction/ADduction: AROM, Both, Other reps (comment), Supine (8 reps) Straight Leg Raises: AROM, Both, 5 reps, Supine Other Exercises Other Exercises: Seated Heel Bends    General Comments General comments (skin integrity, edema, etc.): Achieved 106 degrees of AROM Knee Flexion on LLE      Pertinent Vitals/Pain Pain Assessment Pain Assessment: 0-10 Pain Score: 8  Pain Location: Incision Site Pain Descriptors / Indicators: Throbbing, Operative site guarding Pain Intervention(s): Limited activity within patient's tolerance, Monitored during session    Home Living Family/patient expects to be discharged to:: Private residence Living Arrangements: Spouse/significant other Available Help at Discharge: Family;Available 24 hours/day Type of Home: House Home Access: Stairs to enter Entrance Stairs-Rails: None Entrance Stairs-Number of Steps: 1   Home Layout: One level Home Equipment: Agricultural consultant (2 wheels);Cane - single point      Prior Function            PT Goals (current goals can now be found in the care plan section) Acute Rehab PT Goals Patient Stated Goal: to [facility] home PT Goal Formulation: With patient Time For Goal Achievement: 12/19/22 Potential to Achieve Goals: Good Progress towards PT goals: Progressing toward goals    Frequency    7X/week      PT Plan Current plan remains appropriate    Co-evaluation              AM-PAC PT "6 Clicks" Mobility   Outcome Measure  Help needed turning from your back to your side while in a flat bed without using bedrails?: None Help needed moving from lying on your back to sitting on the side of a flat bed without using bedrails?:  None Help needed moving to and from a bed to a chair (including a wheelchair)?: A Little Help needed standing up from a chair using your arms (e.g., wheelchair or bedside chair)?: A Little Help needed to walk in hospital room?: A Little Help needed climbing 3-5 steps with a railing? : A Little 6 Click Score: 20    End of Session Equipment Utilized During Treatment: Gait belt Activity Tolerance: Patient tolerated treatment well Patient left: in bed;with call bell/phone within reach;with family/visitor present Nurse Communication: Mobility status PT Visit Diagnosis: Other abnormalities of gait and mobility (R26.89);Muscle weakness (generalized) (M62.81)     Time: 1610-9604 PT Time Calculation (min) (ACUTE ONLY): 36 min  Charges:  $Gait Training: 8-22 mins $Therapeutic Exercise: 8-22 mins                     Christene Lye, SPT Acute Rehabilitation Services [phone] Secure chat preferred     Christene Lye 12/16/2022, 12:52 PM

## 2022-12-16 NOTE — Evaluation (Signed)
Occupational Therapy Evaluation Patient Details Name: Sabrina Faulkner MRN: [id number] DOB: October 11, 1955 Today's Date: 12/16/2022   History of Present Illness 67 y.o. female presents to [facility] hospital on 12/15/2022 for elective L TKA. PMH includes DOE, fibromyalgia, GERD, HA, hepatic cirrhosis, HTN, OSA.   Clinical Impression   Patient admitted for the procedure above.  PTA she lives at home with her spouse, who continues to work.  Patient currently is needing Min A for lower body ADL, but she should progress quickly back to baseline as ROM improves and knee pain resolves.  No further OT needs in the acute setting.   Follow up with MD as prescribed.       Recommendations for follow up therapy are one component of a multi-disciplinary discharge planning process, led by the attending physician.  Recommendations may be updated based on patient status, additional functional criteria and insurance authorization.   Assistance Recommended at Discharge Intermittent Supervision/Assistance  Patient can return home with the following Assist for transportation;Assistance with cooking/housework;A little help with bathing/dressing/bathroom    Functional Status Assessment  Patient has had a recent decline in their functional status and demonstrates the ability to make significant improvements in function in a reasonable and predictable amount of time.  Equipment Recommendations  BSC/3in1    Recommendations for Other Services       Precautions / Restrictions Precautions Precautions: Knee;Fall Precaution Booklet Issued: Yes (comment) Restrictions Weight Bearing Restrictions: Yes LLE Weight Bearing: Weight bearing as tolerated      Mobility Bed Mobility Overal bed mobility: Needs Assistance Bed Mobility: Supine to Sit, Sit to Supine     Supine to sit: Modified independent (Device/Increase time) Sit to supine: Min assist        Transfers Overall transfer level: Needs assistance Equipment used:  Rolling walker (2 wheels) Transfers: Sit to/from Stand Sit to Stand: Supervision                  Balance Overall balance assessment: Needs assistance Sitting-balance support: No upper extremity supported, Feet supported Sitting balance-Leahy Scale: Good     Standing balance support: Reliant on assistive device for balance Standing balance-Leahy Scale: Fair                             ADL either performed or assessed with clinical judgement   ADL                       Lower Body Dressing: Minimal assistance;Sit to/from stand                       Vision Patient Visual Report: No change from baseline       Perception     Praxis      Pertinent Vitals/Pain Pain Assessment Pain Assessment: Faces Faces Pain Scale: Hurts little more Pain Location: knee Pain Descriptors / Indicators: Throbbing Pain Intervention(s): Monitored during session     Hand Dominance Right   Extremity/Trunk Assessment Upper Extremity Assessment Upper Extremity Assessment: Overall WFL for tasks assessed   Lower Extremity Assessment Lower Extremity Assessment: Defer to PT evaluation   Cervical / Trunk Assessment Cervical / Trunk Assessment: Normal   Communication Communication Communication: No difficulties   Cognition Arousal/Alertness: Awake/alert Behavior During Therapy: WFL for tasks assessed/performed Overall Cognitive Status: Within Functional Limits for tasks assessed  Home Living Family/patient expects to be discharged to:: Private residence Living Arrangements: Spouse/significant other Available Help at Discharge: Family;Available 24 hours/day Type of Home: House Home Access: Stairs to enter [employer] of Steps: 1 Entrance Stairs-Rails: None Home Layout: One level     Bathroom Shower/Tub: Producer, television/film/video: Standard Bathroom  Accessibility: Yes   Home Equipment: Agricultural consultant (2 wheels);Cane - single point          Prior Functioning/Environment Prior Level of Function : Independent/Modified Independent;Driving                        OT Problem List: Pain      OT Treatment/Interventions:      OT Goals(Current goals can be found in the care plan section) Acute Rehab OT Goals Patient Stated Goal: Return home today OT Goal Formulation: With patient Time For Goal Achievement: 12/19/22 Potential to Achieve Goals: Good  OT Frequency:      Co-evaluation              AM-PAC OT "6 Clicks" Daily Activity     Outcome Measure Help from another person eating meals?: None Help from another person taking care of personal grooming?: None Help from another person toileting, which includes using toliet, bedpan, or urinal?: None Help from another person bathing (including washing, rinsing, drying)?: A Little Help from another person to put on and taking off regular upper body clothing?: None Help from another person to put on and taking off regular lower body clothing?: A Little 6 Click Score: 22   End of Session Equipment Utilized During Treatment: Rolling walker (2 wheels) Nurse Communication: Mobility status  Activity Tolerance: Patient tolerated treatment well Patient left: in bed;with call bell/phone within reach;with family/visitor present  OT Visit Diagnosis: Pain Pain - Right/Left: Left Pain - part of body: Knee                Time: 2956-2130 OT Time Calculation (min): 21 min Charges:  OT General Charges $OT Visit: 1 Visit OT Evaluation $OT Eval Moderate Complexity: 1 Mod  12/16/2022  RP, OTR/L  Acute Rehabilitation Services  Office:  [phone]   Suzanna Obey 12/16/2022, 8:35 AM

## 2022-12-16 NOTE — TOC Transition Note (Signed)
Transition of Care [facility]) - CM/SW Discharge Note   Patient Details  Name: [name] MRN: [id number] Date of Birth: [birth year]  Transition of Care [facility]) CM/SW Contact:  Lawerance Sabal, RN Phone Number: 12/16/2022, 8:45 AM   Clinical Narrative:      Patient with order to DC to home today. Unit staff to provide DME needed for home.   Patient set up from the office prior to admission with [facility] services through [facility] for agency has been notified of DC. Information added to AVS  Patient will have family/ friends provide transportation home. No other TOC needs identified for DC       Patient Goals and CMS Choice      Discharge Placement                         Discharge Plan and Services Additional resources added to the After Visit Summary for                                       Social Determinants of Health (SDOH) Interventions SDOH Screenings   Tobacco Use: Low Risk  (12/04/2022)     Readmission Risk Interventions     No data to display

## 2022-12-16 NOTE — Progress Notes (Addendum)
Subjective: 1 Day Post-Op Procedure(s) (LRB): LEFT TOTAL KNEE ARTHROPLASTY (Left) Patient reports pain as mild.    Objective: Vital signs in last 24 hours: Temp:  [97.3 F (36.3 C)-98.2 F (36.8 C)] 98.1 F (36.7 C) (06/18 0806) Pulse Rate:  [78-88] 84 (06/18 0806) Resp:  [12-20] 16 (06/18 0806) BP: (98-141)/(46-82) 138/67 (06/18 0806) SpO2:  [90 %-97 %] 93 % (06/18 0806)  Intake/Output from previous day: 06/17 0701 - 06/18 0700 In: 2330 [P.O.:480; I.V.:1500; IV Piggyback:350] Out: 2600 [Urine:2550; Blood:50] Intake/Output this shift: No intake/output data recorded.  Recent Labs    12/16/22 0658  HGB 12.7   Recent Labs    12/16/22 0658  WBC 12.1*  RBC 4.06  HCT 37.8  PLT 70*   No results for input(s): "NA", "K", "CL", "CO2", "BUN", "CREATININE", "GLUCOSE", "CALCIUM" in the last 72 hours. No results for input(s): "LABPT", "INR" in the last 72 hours.  Neurologically intact Neurovascular intact Sensation intact distally Intact pulses distally Dorsiflexion/Plantar flexion intact Incision: moderate drainage No cellulitis present Compartment soft   Assessment/Plan: 1 Day Post-Op Procedure(s) (LRB): LEFT TOTAL KNEE ARTHROPLASTY (Left) Advance diet Up with therapy D/C IV fluids Discharge home with home health once has urinated and has cleared PT Will change oxy to norco due to itching WBAT LLE Nurse to change bandage       Cristie Hem 12/16/2022, 8:17 AM

## 2022-12-16 NOTE — Discharge Summary (Addendum)
Patient ID: Sabrina Faulkner MRN: [id number] DOB/AGE: [birth year] 67 y.o.  Admit date: 12/15/2022 Discharge date: 12/16/2022  Admission Diagnoses:  Principal Problem:   Primary osteoarthritis of left knee Active Problems:   Status post total left knee replacement   Discharge Diagnoses:  Same  Past Medical History:  Diagnosis Date   Ambulates with cane    straight cane   Anemia    Arthritis    Dyspnea    ocasional SOB   Esophageal varices (HCC)    Fibromyalgia    GERD (gastroesophageal reflux disease)    occassionally   Headache    no current problems as of 12/04/22   Hearing loss    bilateral - no hearing aids   Heart murmur    NEVER HAS CAUSED ANY PROBLEMS   Hepatic cirrhosis (HCC)    History of blood transfusion    x 1   Hypercholesteremia    Hypertension    Hypothyroidism    Sleep apnea    does not wear CPAP    Surgeries: Procedure(s): LEFT TOTAL KNEE ARTHROPLASTY on 12/15/2022   Consultants:   Discharged Condition: Improved  Hospital Course: SHAYLEI HEARST is an 67 y.o. female who was admitted 12/15/2022 for operative treatment ofPrimary osteoarthritis of left knee. Patient has severe unremitting pain that affects sleep, daily activities, and work/hobbies. After pre-op clearance the patient was taken to the operating room on 12/15/2022 and underwent  Procedure(s): LEFT TOTAL KNEE ARTHROPLASTY.    Patient was given perioperative antibiotics:  Anti-infectives (From admission, onward)    Start     Dose/Rate Route Frequency Ordered Stop   12/15/22 1030  ceFAZolin (ANCEF) IVPB 2g/100 mL premix        2 g 200 mL/hr over 30 Minutes Intravenous Every 6 hours 12/15/22 1022 12/15/22 1839   12/15/22 0757  vancomycin (VANCOCIN) powder  Status:  Discontinued          As needed 12/15/22 0758 12/15/22 0922   12/15/22 0600  ceFAZolin (ANCEF) IVPB 2g/100 mL premix        2 g 200 mL/hr over 30 Minutes Intravenous On call to O.R. 12/15/22 4098 12/15/22 0756         Patient was given sequential compression devices, early ambulation, and chemoprophylaxis to prevent DVT.  Patient benefited maximally from hospital stay and there were no complications.    Recent vital signs: Patient Vitals for the past 24 hrs:  BP Temp Temp src Pulse Resp SpO2  12/16/22 0806 138/67 98.1 F (36.7 C) Oral 84 16 93 %  12/16/22 0512 (!) 140/81 98.2 F (36.8 C) Oral 82 20 97 %  12/16/22 0040 (!) 141/81 97.9 F (36.6 C) Oral 86 18 94 %  12/15/22 2017 (!) 140/77 (!) 97.4 F (36.3 C) Oral 83 18 94 %  12/15/22 1603 132/82 (!) 97.4 F (36.3 C) Oral 78 20 93 %  12/15/22 1042 120/62 97.8 F (36.6 C) Oral 85 18 95 %  12/15/22 1015 (!) 106/53 (!) 97.3 F (36.3 C) -- 82 16 95 %  12/15/22 1000 (!) 102/47 -- -- 85 14 90 %  12/15/22 0945 98/64 -- -- 83 12 93 %  12/15/22 0930 (!) 102/54 -- -- 88 17 95 %  12/15/22 0925 (!) 101/46 98.1 F (36.7 C) -- 87 12 95 %     Recent laboratory studies:  Recent Labs    12/16/22 0658  WBC 12.1*  HGB 12.7  HCT 37.8  PLT 70*  Discharge Medications:   Allergies as of 12/16/2022       Reactions   Cephalexin Diarrhea, Other (See Comments)   C DIFF   Penicillins Rash   PATIENT HAD A PCN REACTION WITH IMMEDIATE RASH, FACIAL/TONGUE/THROAT SWELLING, SOB, OR LIGHTHEADEDNESS WITH HYPOTENSION:  #  #  #  YES  #  #  #  Has patient had a PCN reaction causing severe rash involving mucus membranes or skin necrosis: Unknown Has patient had a PCN reaction that required hospitalization: Unknown Has patient had a PCN reaction occurring within the last 10 years: No If all of the above answers are "NO", then may proceed with Cephalosporin use.        Medication List     STOP taking these medications    gabapentin 300 MG capsule Commonly known as: NEURONTIN   NON FORMULARY   tiZANidine 2 MG tablet Commonly known as: ZANAFLEX   traMADol 50 MG tablet Commonly known as: ULTRAM       TAKE these medications    aspirin EC 81 MG  tablet Take 1 tablet (81 mg total) by mouth 2 (two) times daily. To be taken after surgery to prevent blood clots What changed:  when to take this reasons to take this   atorvastatin 20 MG tablet Commonly known as: LIPITOR Take 10 mg by mouth daily.   calcitRIOL 0.5 MCG capsule Commonly known as: ROCALTROL Take 0.5 mcg by mouth daily.   carvedilol 3.125 MG tablet Commonly known as: COREG Take 3.125 mg by mouth every evening.   docusate sodium 100 MG capsule Commonly known as: Colace Take 1 capsule (100 mg total) by mouth daily as needed.   DULoxetine 30 MG capsule Commonly known as: CYMBALTA Take 30 mg by mouth every evening.   estradiol 0.1 MG/GM vaginal cream Commonly known as: ESTRACE Place 1 Applicatorful vaginally every other day.   fluconazole 150 MG tablet Commonly known as: Diflucan Take once and then repeat the following day if needed   furosemide 20 MG tablet Commonly known as: LASIX Take 20 mg by mouth daily as needed for fluid.   HYDROcodone-acetaminophen 5-325 MG tablet Commonly known as: Norco Take 1-2 tablets by mouth 3 (three) times daily as needed.   levothyroxine 88 MCG tablet Commonly known as: SYNTHROID Take 88 mcg by mouth daily before breakfast.   lisinopril 20 MG tablet Commonly known as: ZESTRIL Take 20 mg by mouth daily.   methocarbamol 750 MG tablet Commonly known as: Robaxin-750 Take 1 tablet (750 mg total) by mouth 2 (two) times daily as needed for muscle spasms. What changed: Another medication with the same name was removed. Continue taking this medication, and follow the directions you see here.   ondansetron 4 MG tablet Commonly known as: Zofran Take 1 tablet (4 mg total) by mouth every 8 (eight) hours as needed for nausea or vomiting.   oxyCODONE-acetaminophen 5-325 MG tablet Commonly known as: Percocet Take 1-2 tablets by mouth every 6 (six) hours as needed. To be taken after surgery   pantoprazole 40 MG tablet Commonly  known as: PROTONIX Take 40 mg by mouth daily.   potassium chloride SA 20 MEQ tablet Commonly known as: KLOR-CON M Take 20 mEq by mouth daily.   ropinirole 5 MG tablet Commonly known as: REQUIP Take 5 mg by mouth at bedtime.               Durable Medical Equipment  (From admission, onward)  Start     Ordered   12/15/22 1051  DME Walker rolling  Once       Question Answer Comment  Walker: With 5 Inch Wheels   Patient needs a walker to treat with the following condition Status post left partial knee replacement      12/15/22 1050   12/15/22 1051  DME 3 n 1  Once        12/15/22 1050   12/15/22 1051  DME Bedside commode  Once       Question:  Patient needs a bedside commode to treat with the following condition  Answer:  Status post left partial knee replacement   12/15/22 1050            Diagnostic Studies: DG Knee Left Port  Result Date: 12/15/2022 CLINICAL DATA:  Left knee arthroplasty. EXAM: PORTABLE LEFT KNEE - 1-2 VIEW COMPARISON:  Left knee x-rays dated Oct 30, 2022. FINDINGS: The left knee demonstrates a total knee arthroplasty without evidence of hardware failure or complication. There is expected intra-articular air. There is no fracture or dislocation. The alignment is anatomic. Post-surgical changes noted in the surrounding soft tissues. IMPRESSION: 1. Left total knee arthroplasty without acute postoperative complication. Electronically Signed   By: Obie Dredge M.D.   On: 12/15/2022 09:56    Disposition: Discharge disposition: 01-Home or Self Care          Follow-up Information     Cristie Hem, PA-C. Schedule an appointment as soon as possible for a visit in 2 week(s).   Specialty: Orthopedic Surgery Contact information: [address] [place] [address] [phone]         [facility] Follow up.   Why: Iantha Fallen)- [facility] referral from Ortho office- they will contact you to schedule- anticipate visit on  6/19 Contact information: [address] [place] [address] [phone]                  Signed: Cristie Hem 12/16/2022, 8:49 AM

## 2022-12-17 ENCOUNTER — Ambulatory Visit (INDEPENDENT_AMBULATORY_CARE_PROVIDER_SITE_OTHER): Payer: Medicare HMO | Admitting: Physician Assistant

## 2022-12-17 ENCOUNTER — Encounter (HOSPITAL_COMMUNITY): Payer: Self-pay | Admitting: Orthopaedic Surgery

## 2022-12-17 ENCOUNTER — Encounter: Payer: Self-pay | Admitting: Orthopaedic Surgery

## 2022-12-17 DIAGNOSIS — M1712 Unilateral primary osteoarthritis, left knee: Secondary | ICD-10-CM

## 2022-12-17 NOTE — Progress Notes (Signed)
Office Visit Note   Patient: Sabrina Faulkner           Date of Birth: 16-Dec-1955           MRN: [id number] Visit Date: 12/17/2022              Requested by: Vivien Presto, MD [phone] [address] [place],  [place] [address] PCP: Vivien Presto, MD  Chief Complaint  Patient presents with   Left Knee - Routine Post Op      HPI:  Sabrina Faulkner is a pleasant 67 year old woman who is 2 days status post left total knee arthroplasty with Dr. Roda Shutters.  She denies any fever chills or calf pain.  She comes in today with increased pain in her knee.  Physical therapy thought it looked a little bit red and asked that we evaluate her.  She has had a lot of pain but says that she had her CPM on yesterday and was told by the therapist that she had it set way too fast.  She thinks this might of made it worse.  Describes her pain is moderate  Assessment & Plan: Visit Diagnoses: Postop left total knee arthroplasty  Plan: Had a discussion with the patient and her husband she has mostly swelling I do not see anything that would indicate an infection she is got some bruising.  Her compartments are soft she is afebrile her wound is closed well no blisters or evidence of reaction to her dressing.  I have placed a picture in the chart.  I would like her to continue to do her ankle pumps but may need to take the next 24 hours and keep icing and elevating her knee above heart level.  She has been given strict return precautions if she develops any induration any fever or chills or blistering.  I suspect most of her problem was being on the CPM for a long period of time yesterday said it too fast of a speed  Follow-Up Instructions: With Dr. Rhett Bannister Exam  Patient is alert, oriented, no adenopathy, well-dressed, normal affect, normal respiratory effort. Examination of her left knee dressing was removed she has well opposed wound edges no erythema over the wound she does have some bruising around the incision.  There  is no evidence of any dressing reaction.  She has no blisters she has no eye outlining for more the dressing was.  Negative Denna Haggard' sign she does have moderate plus swelling.  Imaging: No results found. No images are attached to the encounter.  Labs: Lab Results  Component Value Date   HGBA1C 5.3 11/27/2022   HGBA1C 5.9 (H) 01/27/2015     Lab Results  Component Value Date   ALBUMIN 3.6 04/08/2020   ALBUMIN 3.4 (L) 01/27/2015   PREALBUMIN 10 (L) 11/27/2022    No results found for: "MG" No results found for: "VD25OH"  Lab Results  Component Value Date   PREALBUMIN 10 (L) 11/27/2022      Latest Ref Rng & Units 12/16/2022    6:58 AM 12/04/2022   10:20 AM 04/08/2020    4:55 PM  CBC EXTENDED  WBC 4.0 - 10.5 K/uL 12.1  4.2  4.6   RBC 3.87 - 5.11 MIL/uL 4.06  4.37  4.02   Hemoglobin 12.0 - 15.0 g/dL 41.3  24.4  01.0   HCT 36.0 - 46.0 % 37.8  41.4  38.6   Platelets 150 - 400 K/uL 70  70  77   NEUT# 1.7 - 7.7 K/uL   3.0   Lymph# 0.7 - 4.0 K/uL   1.1      There is no height or weight on file to calculate BMI.  Orders:  No orders of the defined types were placed in this encounter.  No orders of the defined types were placed in this encounter.    Procedures: No procedures performed  Clinical Data: No additional findings.  ROS:  All other systems negative, except as noted in the HPI. Review of Systems  Objective: Vital Signs: There were no vitals taken for this visit.  Specialty Comments:  No specialty comments available.  PMFS History: Patient Active Problem List   Diagnosis Date Noted   Status post total left knee replacement 12/15/2022   Primary osteoarthritis of left knee 12/14/2022   Past Medical History:  Diagnosis Date   Ambulates with cane    straight cane   Anemia    Arthritis    Dyspnea    ocasional SOB   Esophageal varices (HCC)    Fibromyalgia    GERD (gastroesophageal reflux disease)    occassionally   Headache    no current problems  as of 12/04/22   Hearing loss    bilateral - no hearing aids   Heart murmur    NEVER HAS CAUSED ANY PROBLEMS   Hepatic cirrhosis (HCC)    History of blood transfusion    x 1   Hypercholesteremia    Hypertension    Hypothyroidism    Sleep apnea    does not wear CPAP    History reviewed. No pertinent family history.  Past Surgical History:  Procedure Laterality Date   ABDOMINAL HYSTERECTOMY     ANTERIOR CERVICAL DECOMP/DISCECTOMY FUSION N/A 12/23/2016   Procedure: Cervical Five-Six, Cervical Six-Seven Anterior discectomy with fusion and plate fixation;  Surgeon: Ditty, Loura Halt, MD;  Location: [facility] OR;  Service: Neurosurgery;  Laterality: N/A;  C5-6 C6-7 Anterior discectomy with fusion and plate fixation   arthroscopic knee Right    bladder stimulatulation insertion     has remote - will bring on DOS 12/04/22   CARPAL TUNNEL RELEASE Right    COLONOSCOPY     INCONTINENCE SURGERY     bladder sling   livery biopsy     TONSILLECTOMY     TOTAL KNEE ARTHROPLASTY Left 12/15/2022   Procedure: LEFT TOTAL KNEE ARTHROPLASTY;  Surgeon: Tarry Kos, MD;  Location: [facility] OR;  Service: Orthopedics;  Laterality: Left;   ttrigger finger Right    UPPER ENDOSCOPY W/ BANDING     x several   WRIST FRACTURE SURGERY Right    Social History   Occupational History   Not on file  Tobacco Use   Smoking status: Never   Smokeless tobacco: Never  Vaping Use   Vaping Use: Never used  Substance and Sexual Activity   Alcohol use: No   Drug use: No   Sexual activity: Yes    Birth control/protection: Surgical    Comment: Hysterectomy

## 2022-12-17 NOTE — Telephone Encounter (Signed)
Pt is coming to see you at 1:15. FYI

## 2022-12-17 NOTE — Telephone Encounter (Signed)
She should come in for bandage change.

## 2022-12-18 ENCOUNTER — Telehealth: Payer: Self-pay | Admitting: Orthopaedic Surgery

## 2022-12-18 NOTE — Telephone Encounter (Signed)
[facility] called. She is the in home PT. Need verbal orders for 2wk 1x 3wk 2x. Her call back number is [phone]. Also she is having drug reactions.

## 2022-12-18 NOTE — Telephone Encounter (Signed)
Lvm giving verbal ok 

## 2022-12-23 ENCOUNTER — Other Ambulatory Visit: Payer: Self-pay | Admitting: Physician Assistant

## 2022-12-24 ENCOUNTER — Ambulatory Visit (INDEPENDENT_AMBULATORY_CARE_PROVIDER_SITE_OTHER): Payer: Medicare HMO | Admitting: Physician Assistant

## 2022-12-24 ENCOUNTER — Telehealth: Payer: Self-pay

## 2022-12-24 ENCOUNTER — Other Ambulatory Visit: Payer: Self-pay | Admitting: Physician Assistant

## 2022-12-24 DIAGNOSIS — Z96652 Presence of left artificial knee joint: Secondary | ICD-10-CM

## 2022-12-24 MED ORDER — OXYCODONE-ACETAMINOPHEN 5-325 MG PO TABS: 1.0000 | ORAL_TABLET | Freq: Four times a day (QID) | ORAL | 0 refills | Status: DC | PRN

## 2022-12-24 NOTE — Progress Notes (Signed)
Post-Op Visit Note   Patient: Sabrina Faulkner           Date of Birth: Nov 01, 1955           MRN: [id number] Visit Date: 12/24/2022 PCP: Vivien Presto, MD   Assessment & Plan:  Chief Complaint:  Chief Complaint  Patient presents with   Left Knee - Pain   Visit Diagnoses:  1. Status post total left knee replacement     Plan: Patient is a pleasant 67 year old female who comes in today 9 days status post left total knee replacement.  She is here with concerns about pain and swelling.  She has been taking 2 oxycodone every 6 hours in addition to Robaxin.  She has been compliant taking a baby aspirin twice daily for DVT prophylaxis.  She has been getting home health physical therapy but they have only been able to come out twice.  There was physical yesterday but unfortunately she had to go in for an iron infusion.  Of note, she does have cirrhosis of the liver and is anemic.  She does bruise easily.  He has been wearing compression socks.  Examination of the left knee shows moderate ecchymosis throughout.  Her incision is healing with nylon sutures in place.  No evidence of infection or cellulitis.  Calf is soft but swollen to the left lower extremity.  She is neurovascularly intact distally.  At this point, not concerned for infection or DVT.  We have repainted her wound and applied a new Aquacel bandage.  We have wrapped her entire left leg with an Ace bandage to help with the swelling.  Continue with ice.  Continue with physical therapy.  Continue with her baby aspirin twice daily until she is 6 weeks postop.  She will follow-up with [place] at her regularly scheduled 2-week postop appointment.  Call with concerns or questions.  Follow-Up Instructions: Return in about 1 week (around 12/31/2022) for fu at regularly scheduled appointment.   Orders:  No orders of the defined types were placed in this encounter.  No orders of the defined types were placed in this encounter.   Imaging: No new  imaging  PMFS History: Patient Active Problem List   Diagnosis Date Noted   Status post total left knee replacement 12/15/2022   Primary osteoarthritis of left knee 12/14/2022   Past Medical History:  Diagnosis Date   Ambulates with cane    straight cane   Anemia    Arthritis    Dyspnea    ocasional SOB   Esophageal varices (HCC)    Fibromyalgia    GERD (gastroesophageal reflux disease)    occassionally   Headache    no current problems as of 12/04/22   Hearing loss    bilateral - no hearing aids   Heart murmur    NEVER HAS CAUSED ANY PROBLEMS   Hepatic cirrhosis (HCC)    History of blood transfusion    x 1   Hypercholesteremia    Hypertension    Hypothyroidism    Sleep apnea    does not wear CPAP    No family history on file.  Past Surgical History:  Procedure Laterality Date   ABDOMINAL HYSTERECTOMY     ANTERIOR CERVICAL DECOMP/DISCECTOMY FUSION N/A 12/23/2016   Procedure: Cervical Five-Six, Cervical Six-Seven Anterior discectomy with fusion and plate fixation;  Surgeon: Ditty, Loura Halt, MD;  Location: [facility] OR;  Service: Neurosurgery;  Laterality: N/A;  C5-6 C6-7 Anterior discectomy with fusion and  plate fixation   arthroscopic knee Right    bladder stimulatulation insertion     has remote - will bring on DOS 12/04/22   CARPAL TUNNEL RELEASE Right    COLONOSCOPY     INCONTINENCE SURGERY     bladder sling   livery biopsy     TONSILLECTOMY     TOTAL KNEE ARTHROPLASTY Left 12/15/2022   Procedure: LEFT TOTAL KNEE ARTHROPLASTY;  Surgeon: Tarry Kos, MD;  Location: [facility] OR;  Service: Orthopedics;  Laterality: Left;   ttrigger finger Right    UPPER ENDOSCOPY W/ BANDING     x several   WRIST FRACTURE SURGERY Right    Social History   Occupational History   Not on file  Tobacco Use   Smoking status: Never   Smokeless tobacco: Never  Vaping Use   Vaping Use: Never used  Substance and Sexual Activity   Alcohol use: No   Drug use: No   Sexual activity: Yes     Birth control/protection: Surgical    Comment: Hysterectomy

## 2022-12-24 NOTE — Telephone Encounter (Signed)
Patient called and wanted to know if she can have some stronger pain meds sent in ?

## 2022-12-24 NOTE — Telephone Encounter (Signed)
She can increase current rx to 1-2 every 4 hours instead of every 6 hours.  I would not recommend adding anymore nsaids due to medical history though

## 2022-12-26 ENCOUNTER — Other Ambulatory Visit: Payer: Self-pay | Admitting: Physician Assistant

## 2022-12-26 ENCOUNTER — Telehealth: Payer: Self-pay | Admitting: Radiology

## 2022-12-26 NOTE — Telephone Encounter (Signed)
Patient had Knee replacement with Donnald Garre White from [facility], called and states that the patients pain is 9/10 and that he BP is 90/56, then was 88/54, her O2 is 90. She states that she is concerned about this. I did advise that I was going to send an urgent message to Dr. Roda Shutters but that they should also contact her PCP about this as well. They state that she took her pain meds at mid-night. Please call patient back at [phone]

## 2022-12-26 NOTE — Telephone Encounter (Signed)
Spoke with patient. She is on the way to [facility] ED now. I ask that she keep [place] informed because we cannot see chart notes from outside [facility].

## 2022-12-26 NOTE — Telephone Encounter (Signed)
Ok, thanks.

## 2022-12-26 NOTE — Telephone Encounter (Signed)
That is very low bp and oxygen.  I would send to ED

## 2022-12-29 ENCOUNTER — Telehealth: Payer: Self-pay | Admitting: Orthopaedic Surgery

## 2022-12-29 NOTE — Telephone Encounter (Signed)
Patient husband Gery Pray called advised patient is in the hospital because she has a blood clot in her left leg. Patient had an appointment 12/30/2022 to have sutures removed. The number to contact Gery Pray is [phone] or [phone]  patient's number

## 2022-12-30 ENCOUNTER — Encounter: Payer: Medicare HMO | Admitting: Physician Assistant

## 2022-12-30 ENCOUNTER — Ambulatory Visit (INDEPENDENT_AMBULATORY_CARE_PROVIDER_SITE_OTHER): Payer: Medicare HMO | Admitting: Orthopaedic Surgery

## 2022-12-30 DIAGNOSIS — Z96652 Presence of left artificial knee joint: Secondary | ICD-10-CM

## 2022-12-30 MED ORDER — HYDROCODONE-ACETAMINOPHEN 5-325 MG PO TABS: 1.0000 | ORAL_TABLET | Freq: Three times a day (TID) | ORAL | 0 refills | Status: DC | PRN

## 2022-12-30 NOTE — Progress Notes (Signed)
Post-Op Visit Note   Patient: Sabrina Faulkner           Date of Birth: February 03, 1956           MRN: [id number] Visit Date: 12/30/2022 PCP: Vivien Presto, MD   Assessment & Plan:  Chief Complaint:  Chief Complaint  Patient presents with   Left Knee - Routine Post Op   Visit Diagnoses:  1. Status post total left knee replacement     Plan: Niya is 2 weeks status post left total knee replacement.  She was recently found to a left femoral vein DVT.  She is currently on Eliquis.  She is complaining of pain due to swelling of the extremity.  Examination of the left lower extremity shows diffuse swelling without any neurovascular compromise or signs of compartment syndrome.  She does have a fair amount of bruising of the thigh and the calf.  Her surgical incision is healed and intact without any signs of drainage or infection.  Her range of motion is progressing appropriately.  Sutures removed Steri-Strips applied.  I have refilled her pain medications.  She will do her best to elevate her leg.  She will do her best to try to continue to do PT despite her pain and swelling.  Instructions reviewed.  Handicap placard and implant card provided.  Recheck in 4 weeks with two-view x-rays of the left knee.  Follow-Up Instructions: Return in about 4 weeks (around 01/27/2023).   Orders:  No orders of the defined types were placed in this encounter.  Meds ordered this encounter  Medications   HYDROcodone-acetaminophen (NORCO) 5-325 MG tablet    Sig: Take 1-2 tablets by mouth 3 (three) times daily as needed.    Dispense:  40 tablet    Refill:  0    Imaging: No results found.  PMFS History: Patient Active Problem List   Diagnosis Date Noted   Status post total left knee replacement 12/15/2022   Primary osteoarthritis of left knee 12/14/2022   Past Medical History:  Diagnosis Date   Ambulates with cane    straight cane   Anemia    Arthritis    Dyspnea    ocasional SOB    Esophageal varices (HCC)    Fibromyalgia    GERD (gastroesophageal reflux disease)    occassionally   Headache    no current problems as of 12/04/22   Hearing loss    bilateral - no hearing aids   Heart murmur    NEVER HAS CAUSED ANY PROBLEMS   Hepatic cirrhosis (HCC)    History of blood transfusion    x 1   Hypercholesteremia    Hypertension    Hypothyroidism    Sleep apnea    does not wear CPAP    No family history on file.  Past Surgical History:  Procedure Laterality Date   ABDOMINAL HYSTERECTOMY     ANTERIOR CERVICAL DECOMP/DISCECTOMY FUSION N/A 12/23/2016   Procedure: Cervical Five-Six, Cervical Six-Seven Anterior discectomy with fusion and plate fixation;  Surgeon: Ditty, Loura Halt, MD;  Location: [facility] OR;  Service: Neurosurgery;  Laterality: N/A;  C5-6 C6-7 Anterior discectomy with fusion and plate fixation   arthroscopic knee Right    bladder stimulatulation insertion     has remote - will bring on DOS 12/04/22   CARPAL TUNNEL RELEASE Right    COLONOSCOPY     INCONTINENCE SURGERY     bladder sling   livery biopsy     TONSILLECTOMY  TOTAL KNEE ARTHROPLASTY Left 12/15/2022   Procedure: LEFT TOTAL KNEE ARTHROPLASTY;  Surgeon: Tarry Kos, MD;  Location: [facility] OR;  Service: Orthopedics;  Laterality: Left;   ttrigger finger Right    UPPER ENDOSCOPY W/ BANDING     x several   WRIST FRACTURE SURGERY Right    Social History   Occupational History   Not on file  Tobacco Use   Smoking status: Never   Smokeless tobacco: Never  Vaping Use   Vaping Use: Never used  Substance and Sexual Activity   Alcohol use: No   Drug use: No   Sexual activity: Yes    Birth control/protection: Surgical    Comment: Hysterectomy

## 2022-12-30 NOTE — Telephone Encounter (Signed)
Ok to come in to get them removed when she gets out of hospital

## 2022-12-30 NOTE — Telephone Encounter (Signed)
Sabrina Faulkner scheduled.

## 2023-01-05 ENCOUNTER — Other Ambulatory Visit: Payer: Self-pay | Admitting: Physician Assistant

## 2023-01-06 ENCOUNTER — Other Ambulatory Visit: Payer: Self-pay | Admitting: Orthopaedic Surgery

## 2023-01-09 ENCOUNTER — Other Ambulatory Visit: Payer: Self-pay | Admitting: Physician Assistant

## 2023-01-09 ENCOUNTER — Telehealth: Payer: Self-pay | Admitting: Orthopaedic Surgery

## 2023-01-09 MED ORDER — ONDANSETRON HCL 4 MG PO TABS: 4.0000 mg | ORAL_TABLET | Freq: Three times a day (TID) | ORAL | 0 refills | Status: AC | PRN

## 2023-01-09 MED ORDER — HYDROCODONE-ACETAMINOPHEN 5-325 MG PO TABS: 1.0000 | ORAL_TABLET | Freq: Three times a day (TID) | ORAL | 0 refills | Status: DC | PRN

## 2023-01-09 MED ORDER — OXYCODONE-ACETAMINOPHEN 5-325 MG PO TABS: 1.0000 | ORAL_TABLET | Freq: Four times a day (QID) | ORAL | 0 refills | Status: DC | PRN

## 2023-01-09 MED ORDER — METHOCARBAMOL 750 MG PO TABS: 750.0000 mg | ORAL_TABLET | Freq: Two times a day (BID) | ORAL | 2 refills | Status: AC | PRN

## 2023-01-09 NOTE — Telephone Encounter (Signed)
Pt husband called in stating medication was not strong enough and she would like something different and also her Nausea medication is running low please refill ondansetron.

## 2023-01-09 NOTE — Telephone Encounter (Signed)
sent 

## 2023-01-09 NOTE — Telephone Encounter (Signed)
Let patient's son know. Patient was not answering and may be asleep, per son.

## 2023-01-09 NOTE — Telephone Encounter (Signed)
I refilled the norco earlier today, so I have tried to discontinue that and I sent in oxy and zofran

## 2023-01-09 NOTE — Telephone Encounter (Signed)
Pt requesting refill on Hydrocodone and Methocarbamol sent to the drug store in [place]

## 2023-01-15 ENCOUNTER — Telehealth: Payer: Self-pay | Admitting: Orthopaedic Surgery

## 2023-01-15 MED ORDER — OXYCODONE-ACETAMINOPHEN 5-325 MG PO TABS: 1.0000 | ORAL_TABLET | Freq: Four times a day (QID) | ORAL | 0 refills | Status: AC | PRN

## 2023-01-15 NOTE — Telephone Encounter (Signed)
Notified patient.

## 2023-01-15 NOTE — Telephone Encounter (Signed)
Patient called advised she is still in a great deal of pain and can't sleep at night. Patient said the Hydrocodone is not working at all. Patient asked what can she do at this point to get some relief? Patient asked if she can get something a little stronger.  The number to contact patient is [phone]

## 2023-01-15 NOTE — Telephone Encounter (Signed)
done

## 2023-01-21 ENCOUNTER — Other Ambulatory Visit (HOSPITAL_COMMUNITY): Payer: Self-pay | Admitting: Adult Health Nurse Practitioner

## 2023-01-21 DIAGNOSIS — Z1231 Encounter for screening mammogram for malignant neoplasm of breast: Secondary | ICD-10-CM

## 2023-01-27 ENCOUNTER — Other Ambulatory Visit (INDEPENDENT_AMBULATORY_CARE_PROVIDER_SITE_OTHER): Payer: Medicare HMO

## 2023-01-27 ENCOUNTER — Ambulatory Visit (INDEPENDENT_AMBULATORY_CARE_PROVIDER_SITE_OTHER): Payer: Medicare HMO | Admitting: Orthopaedic Surgery

## 2023-01-27 ENCOUNTER — Encounter: Payer: Self-pay | Admitting: Orthopaedic Surgery

## 2023-01-27 DIAGNOSIS — Z96652 Presence of left artificial knee joint: Secondary | ICD-10-CM | POA: Diagnosis not present

## 2023-01-27 MED ORDER — HYDROCODONE-ACETAMINOPHEN 5-325 MG PO TABS: 1.0000 | ORAL_TABLET | Freq: Two times a day (BID) | ORAL | 0 refills | Status: DC | PRN

## 2023-01-27 NOTE — Progress Notes (Signed)
Post-Op Visit Note   Patient: Sabrina Faulkner           Date of Birth: 06/24/1956           MRN: [id number] Visit Date: 01/27/2023 PCP: Vivien Presto, MD   Assessment & Plan:  Chief Complaint:  Chief Complaint  Patient presents with   Left Knee - Follow-up    Left total knee arthroplasty 12/15/2022   Visit Diagnoses:  1. Status post total left knee replacement     Plan: Sephra is a 6 weeks status post left total knee replacement.  She states her pain is better overall.  Her last session of PT is tomorrow.  She has been doing her exercises diligently.  Examination of the left knee shows fully healed surgical scar.  Her range of motion is progressing very nicely.  It is 0 to 110 degrees.  Expected postoperative swelling.  No signs of infection.  From my standpoint Sabrina Faulkner is a regaining her range of motion very well and I think it is fine for her not to do outpatient PT.  She would also like to avoid this as she feels that she may not be able to afford the $20 co-pay for PT.  We will make sure that she has follow-up for ongoing treatment for the postop DVT.  She is currently on her last week of Eliquis.  Follow-Up Instructions: Return in about 6 weeks (around 03/10/2023) for Sabrina Faulkner.   Orders:  Orders Placed This Encounter  Procedures   XR Knee 1-2 Views Left   Meds ordered this encounter  Medications   HYDROcodone-acetaminophen (NORCO) 5-325 MG tablet    Sig: Take 1-2 tablets by mouth 2 (two) times daily as needed.    Dispense:  30 tablet    Refill:  0    Imaging: XR Knee 1-2 Views Left  Result Date: 01/27/2023 Stable left total knee replacement in good alignment.    PMFS History: Patient Active Problem List   Diagnosis Date Noted   Status post total left knee replacement 12/15/2022   Primary osteoarthritis of left knee 12/14/2022   Past Medical History:  Diagnosis Date   Ambulates with cane    straight cane   Anemia    Arthritis    Dyspnea     ocasional SOB   Esophageal varices (HCC)    Fibromyalgia    GERD (gastroesophageal reflux disease)    occassionally   Headache    no current problems as of 12/04/22   Hearing loss    bilateral - no hearing aids   Heart murmur    NEVER HAS CAUSED ANY PROBLEMS   Hepatic cirrhosis (HCC)    History of blood transfusion    x 1   Hypercholesteremia    Hypertension    Hypothyroidism    Sleep apnea    does not wear CPAP    No family history on file.  Past Surgical History:  Procedure Laterality Date   ABDOMINAL HYSTERECTOMY     ANTERIOR CERVICAL DECOMP/DISCECTOMY FUSION N/A 12/23/2016   Procedure: Cervical Five-Six, Cervical Six-Seven Anterior discectomy with fusion and plate fixation;  Surgeon: Ditty, Loura Halt, MD;  Location: [facility] OR;  Service: Neurosurgery;  Laterality: N/A;  C5-6 C6-7 Anterior discectomy with fusion and plate fixation   arthroscopic knee Right    bladder stimulatulation insertion     has remote - will bring on DOS 12/04/22   CARPAL TUNNEL RELEASE Right    COLONOSCOPY  INCONTINENCE SURGERY     bladder sling   livery biopsy     TONSILLECTOMY     TOTAL KNEE ARTHROPLASTY Left 12/15/2022   Procedure: LEFT TOTAL KNEE ARTHROPLASTY;  Surgeon: Tarry Kos, MD;  Location: [facility] OR;  Service: Orthopedics;  Laterality: Left;   ttrigger finger Right    UPPER ENDOSCOPY W/ BANDING     x several   WRIST FRACTURE SURGERY Right    Social History   Occupational History   Not on file  Tobacco Use   Smoking status: Never   Smokeless tobacco: Never  Vaping Use   Vaping status: Never Used  Substance and Sexual Activity   Alcohol use: No   Drug use: No   Sexual activity: Yes    Birth control/protection: Surgical    Comment: Hysterectomy

## 2023-03-09 ENCOUNTER — Encounter (HOSPITAL_COMMUNITY): Payer: Self-pay

## 2023-03-09 ENCOUNTER — Ambulatory Visit (HOSPITAL_COMMUNITY)
Admission: RE | Admit: 2023-03-09 | Discharge: 2023-03-09 | Disposition: A | Discharge status: Home / Self Care | Payer: Medicare HMO | Source: Ambulatory Visit | Attending: Adult Health Nurse Practitioner | Admitting: Adult Health Nurse Practitioner

## 2023-03-09 DIAGNOSIS — Z1231 Encounter for screening mammogram for malignant neoplasm of breast: Secondary | ICD-10-CM | POA: Insufficient documentation

## 2023-03-10 ENCOUNTER — Ambulatory Visit: Payer: Medicare HMO | Admitting: Orthopaedic Surgery

## 2023-03-10 ENCOUNTER — Encounter: Payer: Self-pay | Admitting: Orthopaedic Surgery

## 2023-03-10 ENCOUNTER — Other Ambulatory Visit (INDEPENDENT_AMBULATORY_CARE_PROVIDER_SITE_OTHER): Payer: Medicare HMO

## 2023-03-10 DIAGNOSIS — Z96652 Presence of left artificial knee joint: Secondary | ICD-10-CM

## 2023-03-10 NOTE — Progress Notes (Signed)
Post-Op Visit Note   Patient: Sabrina Faulkner           Date of Birth: 1956/03/03           MRN: [id number] Visit Date: 03/10/2023 PCP: Vivien Presto, MD   Assessment & Plan:  Chief Complaint:  Chief Complaint  Patient presents with   Left Knee - Follow-up    Left total knee arthroplasty 12/15/2022   Visit Diagnoses:  1. Status post total left knee replacement     Plan: Cobi is 3 months status post left total knee replacement.  She states that the skin is still sensitive to touch.  Overall she is getting around okay.  Examination shows fully healed surgical scar.  She has excellent range of motion.  Collaterals are stable.  X-rays show stable implant without any complications.  Dental prophylaxis reinforced.  Will see her back in 3 months for another recheck with repeat x-rays of the left knee.  Follow-Up Instructions: Return in about 3 months (around 06/09/2023).   Orders:  Orders Placed This Encounter  Procedures   XR Knee 1-2 Views Left   No orders of the defined types were placed in this encounter.   Imaging: XR Knee 1-2 Views Left  Result Date: 03/10/2023 Stable left total knee replacement in good alignment.    PMFS History: Patient Active Problem List   Diagnosis Date Noted   Status post total left knee replacement 12/15/2022   Primary osteoarthritis of left knee 12/14/2022   Past Medical History:  Diagnosis Date   Ambulates with cane    straight cane   Anemia    Arthritis    Dyspnea    ocasional SOB   Esophageal varices (HCC)    Fibromyalgia    GERD (gastroesophageal reflux disease)    occassionally   Headache    no current problems as of 12/04/22   Hearing loss    bilateral - no hearing aids   Heart murmur    NEVER HAS CAUSED ANY PROBLEMS   Hepatic cirrhosis (HCC)    History of blood transfusion    x 1   Hypercholesteremia    Hypertension    Hypothyroidism    Sleep apnea    does not wear CPAP    No family history on file.  Past  Surgical History:  Procedure Laterality Date   ABDOMINAL HYSTERECTOMY     ANTERIOR CERVICAL DECOMP/DISCECTOMY FUSION N/A 12/23/2016   Procedure: Cervical Five-Six, Cervical Six-Seven Anterior discectomy with fusion and plate fixation;  Surgeon: Ditty, Loura Halt, MD;  Location: [facility] OR;  Service: Neurosurgery;  Laterality: N/A;  C5-6 C6-7 Anterior discectomy with fusion and plate fixation   arthroscopic knee Right    bladder stimulatulation insertion     has remote - will bring on DOS 12/04/22   CARPAL TUNNEL RELEASE Right    COLONOSCOPY     INCONTINENCE SURGERY     bladder sling   livery biopsy     TONSILLECTOMY     TOTAL KNEE ARTHROPLASTY Left 12/15/2022   Procedure: LEFT TOTAL KNEE ARTHROPLASTY;  Surgeon: Tarry Kos, MD;  Location: [facility] OR;  Service: Orthopedics;  Laterality: Left;   ttrigger finger Right    UPPER ENDOSCOPY W/ BANDING     x several   WRIST FRACTURE SURGERY Right    Social History   Occupational History   Not on file  Tobacco Use   Smoking status: Never   Smokeless tobacco: Never  Vaping Use  Vaping status: Never Used  Substance and Sexual Activity   Alcohol use: No   Drug use: No   Sexual activity: Yes    Birth control/protection: Surgical    Comment: Hysterectomy

## 2023-03-16 ENCOUNTER — Other Ambulatory Visit (HOSPITAL_COMMUNITY): Payer: Self-pay | Admitting: Adult Health Nurse Practitioner

## 2023-03-16 DIAGNOSIS — R928 Other abnormal and inconclusive findings on diagnostic imaging of breast: Secondary | ICD-10-CM

## 2023-03-24 ENCOUNTER — Ambulatory Visit (HOSPITAL_COMMUNITY)
Admission: RE | Admit: 2023-03-24 | Discharge: 2023-03-24 | Disposition: A | Discharge status: Home / Self Care | Payer: Medicare HMO | Source: Ambulatory Visit | Attending: Adult Health Nurse Practitioner

## 2023-03-24 ENCOUNTER — Ambulatory Visit (HOSPITAL_COMMUNITY)
Admission: RE | Admit: 2023-03-24 | Discharge: 2023-03-24 | Disposition: A | Discharge status: Home / Self Care | Payer: Medicare HMO | Source: Ambulatory Visit | Attending: Adult Health Nurse Practitioner | Admitting: Adult Health Nurse Practitioner

## 2023-03-24 ENCOUNTER — Encounter (HOSPITAL_COMMUNITY): Payer: Self-pay

## 2023-03-24 DIAGNOSIS — R928 Other abnormal and inconclusive findings on diagnostic imaging of breast: Secondary | ICD-10-CM

## 2023-05-21 ENCOUNTER — Ambulatory Visit: Payer: Medicare HMO

## 2023-06-08 NOTE — Progress Notes (Unsigned)
Office Visit Note   Patient: Sabrina Faulkner           Date of Birth: 12/12/1955           MRN: [id number] Visit Date: 06/09/2023              Requested by: Vivien Presto, MD [phone] [address] [place],  [place] [address] PCP: Vivien Presto, MD   Assessment & Plan: Visit Diagnoses:  1. Status post total left knee replacement     Plan: ***  Follow-Up Instructions: No follow-ups on file.   Orders:  No orders of the defined types were placed in this encounter.  No orders of the defined types were placed in this encounter.     Procedures: No procedures performed   Clinical Data: No additional findings.   Subjective: No chief complaint on file.   HPI  Review of Systems   Objective: Vital Signs: There were no vitals taken for this visit.  Physical Exam  Ortho Exam  Specialty Comments:  No specialty comments available.  Imaging: No results found.   PMFS History: Patient Active Problem List   Diagnosis Date Noted   Status post total left knee replacement 12/15/2022   Primary osteoarthritis of left knee 12/14/2022   Past Medical History:  Diagnosis Date   Ambulates with cane    straight cane   Anemia    Arthritis    Dyspnea    ocasional SOB   Esophageal varices (HCC)    Fibromyalgia    GERD (gastroesophageal reflux disease)    occassionally   Headache    no current problems as of 12/04/22   Hearing loss    bilateral - no hearing aids   Heart murmur    NEVER HAS CAUSED ANY PROBLEMS   Hepatic cirrhosis (HCC)    History of blood transfusion    x 1   Hypercholesteremia    Hypertension    Hypothyroidism    Sleep apnea    does not wear CPAP    No family history on file.  Past Surgical History:  Procedure Laterality Date   ABDOMINAL HYSTERECTOMY     ANTERIOR CERVICAL DECOMP/DISCECTOMY FUSION N/A 12/23/2016   Procedure: Cervical Five-Six, Cervical Six-Seven Anterior discectomy with fusion and plate fixation;  Surgeon: Ditty,  Loura Halt, MD;  Location: [facility] OR;  Service: Neurosurgery;  Laterality: N/A;  C5-6 C6-7 Anterior discectomy with fusion and plate fixation   arthroscopic knee Right    bladder stimulatulation insertion     has remote - will bring on DOS 12/04/22   CARPAL TUNNEL RELEASE Right    COLONOSCOPY     INCONTINENCE SURGERY     bladder sling   livery biopsy     TONSILLECTOMY     TOTAL KNEE ARTHROPLASTY Left 12/15/2022   Procedure: LEFT TOTAL KNEE ARTHROPLASTY;  Surgeon: Tarry Kos, MD;  Location: [facility] OR;  Service: Orthopedics;  Laterality: Left;   ttrigger finger Right    UPPER ENDOSCOPY W/ BANDING     x several   WRIST FRACTURE SURGERY Right    Social History   Occupational History   Not on file  Tobacco Use   Smoking status: Never   Smokeless tobacco: Never  Vaping Use   Vaping status: Never Used  Substance and Sexual Activity   Alcohol use: No   Drug use: No   Sexual activity: Yes    Birth control/protection: Surgical    Comment: Hysterectomy

## 2023-06-09 ENCOUNTER — Ambulatory Visit (INDEPENDENT_AMBULATORY_CARE_PROVIDER_SITE_OTHER): Payer: Medicare HMO | Admitting: Orthopaedic Surgery

## 2023-06-09 ENCOUNTER — Other Ambulatory Visit (INDEPENDENT_AMBULATORY_CARE_PROVIDER_SITE_OTHER): Payer: Self-pay

## 2023-06-09 DIAGNOSIS — Z96652 Presence of left artificial knee joint: Secondary | ICD-10-CM

## 2023-09-23 ENCOUNTER — Ambulatory Visit: Attending: Sports Medicine

## 2023-09-23 DIAGNOSIS — M25512 Pain in left shoulder: Secondary | ICD-10-CM | POA: Diagnosis present

## 2023-09-23 DIAGNOSIS — M25612 Stiffness of left shoulder, not elsewhere classified: Secondary | ICD-10-CM | POA: Insufficient documentation

## 2023-09-23 NOTE — Therapy (Signed)
 OUTPATIENT PHYSICAL THERAPY UPPER EXTREMITY EVALUATION   Patient Name: Sabrina Faulkner MRN: [id number] DOB:March 21, 1956, 68 y.o., female Today's Date: 09/23/2023  END OF SESSION:  PT End of Session - 09/23/23 1504     Visit Number 1    Number of Visits 12    Date for PT Re-Evaluation 11/27/23    PT Start Time 1302    PT Stop Time 1335    PT Time Calculation (min) 33 min    Activity Tolerance Patient tolerated treatment well    Behavior During Therapy WFL for tasks assessed/performed             Past Medical History:  Diagnosis Date   Ambulates with cane    straight cane   Anemia    Arthritis    Dyspnea    ocasional SOB   Esophageal varices (HCC)    Fibromyalgia    GERD (gastroesophageal reflux disease)    occassionally   Headache    no current problems as of 12/04/22   Hearing loss    bilateral - no hearing aids   Heart murmur    NEVER HAS CAUSED ANY PROBLEMS   Hepatic cirrhosis (HCC)    History of blood transfusion    x 1   Hypercholesteremia    Hypertension    Hypothyroidism    Sleep apnea    does not wear CPAP   Past Surgical History:  Procedure Laterality Date   ABDOMINAL HYSTERECTOMY     ANTERIOR CERVICAL DECOMP/DISCECTOMY FUSION N/A 12/23/2016   Procedure: Cervical Five-Six, Cervical Six-Seven Anterior discectomy with fusion and plate fixation;  Surgeon: Ditty, Loura Halt, MD;  Location: [facility] OR;  Service: Neurosurgery;  Laterality: N/A;  C5-6 C6-7 Anterior discectomy with fusion and plate fixation   arthroscopic knee Right    bladder stimulatulation insertion     has remote - will bring on DOS 12/04/22   CARPAL TUNNEL RELEASE Right    COLONOSCOPY     INCONTINENCE SURGERY     bladder sling   livery biopsy     TONSILLECTOMY     TOTAL KNEE ARTHROPLASTY Left 12/15/2022   Procedure: LEFT TOTAL KNEE ARTHROPLASTY;  Surgeon: Tarry Kos, MD;  Location: [facility] OR;  Service: Orthopedics;  Laterality: Left;   ttrigger finger Right    UPPER ENDOSCOPY W/  BANDING     x several   WRIST FRACTURE SURGERY Right    Patient Active Problem List   Diagnosis Date Noted   Status post total left knee replacement 12/15/2022   Primary osteoarthritis of left knee 12/14/2022   REFERRING PROVIDER: Frederico Hamman, PA-C   REFERRING DIAG: Unspecified fracture of upper end of left humerus, initial encounter for closed fracture   THERAPY DIAG:  Acute pain of left shoulder  Stiffness of left shoulder, not elsewhere classified  Rationale for Evaluation and Treatment: Rehabilitation  ONSET DATE: 08/27/23  SUBJECTIVE:   SUBJECTIVE STATEMENT: Patient left broke arm via falling backwards on her daughter's porch. Reported feeling soar and painful. Pain is primarily located in the left upper arm but did mention having left shoulder pain prior to most recent injury. Pain has stayed steady. She reported poor sleep due to arm pain that keeps her awake the majority of the night. [place] Spaniel it has been very difficult being able to perform ADL's. Mentioned cyanosis of the liver and having issues with her memory. At her doctor's appointment yesterday, her doctor told her to not use her sling anymore. She is right hand  dominant  PERTINENT HISTORY: Osteoporosis, history of falling, depression, chronic pain, obesity, hypertension, fibromyalgia, liver cirrhosis, osteoarthritis hearing loss, and memory deficits PAIN:  Are you having pain? Yes: NPRS scale: 7/10 Pain location: left upper arm and shoulder  Pain description: Sharp, Sore, Throbbing Aggravating factors: Moving the arm in general Relieving factors: Having the arm resting on stomach or supported  PRECAUTIONS: Shoulder and Fall; no lifting with left arm   RED FLAGS: None   WEIGHT BEARING RESTRICTIONS: No  FALLS:  Has patient fallen in last 6 months? Yes. Number of falls 1  LIVING ENVIRONMENT: Lives with: lives with their spouse Lives in: House/apartment Stairs: Yes: Internal: 20 steps; on left going  up Has following equipment at home: Single point cane  OCCUPATION: Retired  PLOF: Independent with community mobility with device  PATIENT GOALS: Decrease pain, Sleep longer, improve strength.   NEXT MD VISIT: 4 weeks from 09/23/23  OBJECTIVE:  Note: Objective measures were completed at Evaluation unless otherwise noted.  COGNITION: Overall cognitive status: Within functional limits for tasks assessed     SENSATION: Patient reports no numbness or tinging  PALPATION: No tenderness to palpation  JOINT MOBILITY:  Left glenohumeral: hypomobile and nonpainful   UPPER EXTREMITY ROM: AROM and PROM assessed in supine  Active ROM Right eval Left eval  Shoulder flexion 126 107 (AAROM) PROM: 127  Shoulder extension    Shoulder abduction 147 73 PROM: 90  Shoulder adduction    Shoulder extension    Shoulder internal rotation    Shoulder external rotation    Elbow flexion    Elbow extension    Wrist flexion    Wrist extension    Wrist ulnar deviation    Wrist radial deviation    Wrist pronation    Wrist supination     (Blank rows = not tested)   UPPER EXTREMITY MMT: not tested at this time due to pain severity                                                                                     TREATMENT DATE:     PATIENT EDUCATION:  Education details: Healing, prognosis, anatomy, and goals for physical therapy Person educated: Patient Education method: Explanation Education comprehension: verbalized understanding  HOME EXERCISE PROGRAM:   ASSESSMENT:  CLINICAL IMPRESSION: Patient is a 68 y.o. female who was seen today for physical therapy evaluation and treatment for left shoulder pain and stiffness secondary to a fall and Humerus fracture on 08/27/23.  She presented with moderate to high pain severity and irritability with left shoulder active range of motion being the most aggravating to her familiar symptoms.  Recommend that she continue with skilled physical  therapy to address her impairments to return to her prior level of function.  OBJECTIVE IMPAIRMENTS: decreased activity tolerance, decreased knowledge of condition, decreased ROM, decreased strength, and pain.   ACTIVITY LIMITATIONS: carrying, lifting, sleeping, and reach over head  PARTICIPATION LIMITATIONS: meal prep, cleaning, laundry, shopping, and community activity  PERSONAL FACTORS: Past/current experiences and 3+ comorbidities: Osteoporosis, history of falling, depression, chronic pain, obesity, hypertension, fibromyalgia, liver cirrhosis, osteoarthritis hearing loss, and memory deficits  are also affecting  patient's functional outcome.   REHAB POTENTIAL: Good  CLINICAL DECISION MAKING: Evolving/moderate complexity  EVALUATION COMPLEXITY: Moderate   GOALS: Goals reviewed with patient? Yes  SHORT TERM GOALS: Target date: 10/14/23 Patient will be independent with her initial HEP.  Baseline: Goal status: INITIAL  2.  Patient's resting shoulder pain will decrease from 7/10 to 5/10 or less to allow for improved sleep.  Baseline:  Goal status: INITIAL  3.  Patient will be able  to demonstrate at least 115 degrees of active left shoulder flexion for improved function retrieving dishes from her cabinets.  Baseline:  Goal status: INITIAL  4.  Patient will be able to demonstrate at least 90 degrees of active left shoulder abduction for improved functional reaching. Baseline:  Goal status: INITIAL  LONG TERM GOALS: Target date: 11/04/23  Patient will be independent with her advanced HEP.  Baseline:  Goal status: INITIAL  2.  Patient will be able to demonstrate at least 125 degrees of active left shoulder flexion for improved function with overhead ADL's. Baseline:  Goal status: INITIAL  3.  Patient will reduce her pain to at least a 3/10 for improved function sleeping.  Baseline:  Goal status: INITIAL  4.  Patient will be able to carry at least 5 pounds with her left  upper extremity for improved function shopping. Baseline:  Goal status: INITIAL  5.  Patient will be able to demonstrate at least 110 degrees of active left shoulder abduction for improved function reaching overhead. Baseline:  Goal status: INITIAL  PLAN:  PT FREQUENCY: 2x/week  PT DURATION: 6 weeks  PLANNED INTERVENTIONS: 97164- PT Re-evaluation, 97110-Therapeutic exercises, 97530- Therapeutic activity, 97112- Neuromuscular re-education, 97535- Self Care, 16109- Manual therapy, G0283- Electrical stimulation (unattended), 97016- Vasopneumatic device, Patient/Family education, Joint mobilization, Cryotherapy, and Moist heat  PLAN FOR NEXT SESSION: Pulleys, isometrics, active assisted range of motion, manual therapy, and modalities as needed   Granville Lewis, PT 09/23/2023, 6:23 PM

## 2023-09-28 ENCOUNTER — Encounter: Payer: Self-pay | Admitting: *Deleted

## 2023-09-28 ENCOUNTER — Ambulatory Visit: Admitting: *Deleted

## 2023-09-28 DIAGNOSIS — M25512 Pain in left shoulder: Secondary | ICD-10-CM | POA: Diagnosis not present

## 2023-09-28 DIAGNOSIS — M25612 Stiffness of left shoulder, not elsewhere classified: Secondary | ICD-10-CM

## 2023-09-28 NOTE — Therapy (Signed)
 OUTPATIENT PHYSICAL THERAPY UPPER EXTREMITY EVALUATION   Patient Name: Sabrina Faulkner MRN: [id number] DOB:19-Dec-1955, 68 y.o., female Today's Date: 09/28/2023  END OF SESSION:  PT End of Session - 09/28/23 1059     Visit Number 2    Number of Visits 12    Date for PT Re-Evaluation 11/27/23    PT Start Time 1100    PT Stop Time 1149    PT Time Calculation (min) 49 min             Past Medical History:  Diagnosis Date   Ambulates with cane    straight cane   Anemia    Arthritis    Dyspnea    ocasional SOB   Esophageal varices (HCC)    Fibromyalgia    GERD (gastroesophageal reflux disease)    occassionally   Headache    no current problems as of 12/04/22   Hearing loss    bilateral - no hearing aids   Heart murmur    NEVER HAS CAUSED ANY PROBLEMS   Hepatic cirrhosis (HCC)    History of blood transfusion    x 1   Hypercholesteremia    Hypertension    Hypothyroidism    Sleep apnea    does not wear CPAP   Past Surgical History:  Procedure Laterality Date   ABDOMINAL HYSTERECTOMY     ANTERIOR CERVICAL DECOMP/DISCECTOMY FUSION N/A 12/23/2016   Procedure: Cervical Five-Six, Cervical Six-Seven Anterior discectomy with fusion and plate fixation;  Surgeon: Ditty, Loura Halt, MD;  Location: [facility] OR;  Service: Neurosurgery;  Laterality: N/A;  C5-6 C6-7 Anterior discectomy with fusion and plate fixation   arthroscopic knee Right    bladder stimulatulation insertion     has remote - will bring on DOS 12/04/22   CARPAL TUNNEL RELEASE Right    COLONOSCOPY     INCONTINENCE SURGERY     bladder sling   livery biopsy     TONSILLECTOMY     TOTAL KNEE ARTHROPLASTY Left 12/15/2022   Procedure: LEFT TOTAL KNEE ARTHROPLASTY;  Surgeon: Tarry Kos, MD;  Location: [facility] OR;  Service: Orthopedics;  Laterality: Left;   ttrigger finger Right    UPPER ENDOSCOPY W/ BANDING     x several   WRIST FRACTURE SURGERY Right    Patient Active Problem List   Diagnosis Date Noted   Status  post total left knee replacement 12/15/2022   Primary osteoarthritis of left knee 12/14/2022   REFERRING PROVIDER: Frederico Hamman, PA-C   REFERRING DIAG: Unspecified fracture of upper end of left humerus, initial encounter for closed fracture   THERAPY DIAG:  Acute pain of left shoulder  Stiffness of left shoulder, not elsewhere classified  Rationale for Evaluation and Treatment: Rehabilitation  ONSET DATE: 08/27/23  SUBJECTIVE:   SUBJECTIVE STATEMENT: Patient LT shldr pain  7-8/10  PERTINENT HISTORY: Osteoporosis, history of falling, depression, chronic pain, obesity, hypertension, fibromyalgia, liver cirrhosis, osteoarthritis hearing loss, and memory deficits PAIN:  Are you having pain? Yes: NPRS scale: 7-8/10 Pain location: left upper arm and shoulder  Pain description: Sharp, Sore, Throbbing Aggravating factors: Moving the arm in general Relieving factors: Having the arm resting on stomach or supported  PRECAUTIONS: Shoulder and Fall; no lifting with left arm   RED FLAGS: None   WEIGHT BEARING RESTRICTIONS: No  FALLS:  Has patient fallen in last 6 months? Yes. Number of falls 1  LIVING ENVIRONMENT: Lives with: lives with their spouse Lives in: House/apartment Stairs: Yes: Internal:  20 steps; on left going up Has following equipment at home: Single point cane  OCCUPATION: Retired  PLOF: Independent with community mobility with device  PATIENT GOALS: Decrease pain, Sleep longer, improve strength.   NEXT MD VISIT: 4 weeks from 09/23/23  OBJECTIVE:  Note: Objective measures were completed at Evaluation unless otherwise noted.  COGNITION: Overall cognitive status: Within functional limits for tasks assessed     SENSATION: Patient reports no numbness or tinging  PALPATION: No tenderness to palpation  JOINT MOBILITY:  Left glenohumeral: hypomobile and nonpainful   UPPER EXTREMITY ROM: AROM and PROM assessed in supine  Active ROM Right eval  Left eval  Shoulder flexion 126 107 (AAROM) PROM: 127  Shoulder extension    Shoulder abduction 147 73 PROM: 90  Shoulder adduction    Shoulder extension    Shoulder internal rotation    Shoulder external rotation    Elbow flexion    Elbow extension    Wrist flexion    Wrist extension    Wrist ulnar deviation    Wrist radial deviation    Wrist pronation    Wrist supination     (Blank rows = not tested)   UPPER EXTREMITY MMT: not tested at this time due to pain severity                                                                                     TREATMENT DATE:  09-28-23                                    EXERCISE LOG   LT shldr  Exercise Repetitions and Resistance Comments  Nustep  L1 x  11  mins   seat   Pulleys X 5 mins   UE Ranger X  5 mins   Table slides  Flexion/ ext, CW/CCW   x 10   Wall slides  Flexion x10 with RT UE assist    Blank cell = exercise not performed today  Manual AAROM for elevation and ER IFC x 15 mins 80-150hz  100% scan with HMP x 15 mins LT shldr seated. Discussed HEP    PATIENT EDUCATION:  Education details: Healing, prognosis, anatomy, and goals for physical therapy Person educated: Patient Education method: Explanation Education comprehension: verbalized understanding  HOME EXERCISE PROGRAM:   ASSESSMENT:  CLINICAL IMPRESSION: Patient is a 68 y.o. female who was seen today for physical therapy evaluation and treatment for left shoulder pain and stiffness secondary to a fall and Humerus fracture on 08/27/23. Rx focused on LT shldr AROM/ AAROM exs as well as manual  PROM for ROM progression.   OBJECTIVE IMPAIRMENTS: decreased activity tolerance, decreased knowledge of condition, decreased ROM, decreased strength, and pain.   ACTIVITY LIMITATIONS: carrying, lifting, sleeping, and reach over head  PARTICIPATION LIMITATIONS: meal prep, cleaning, laundry, shopping, and community activity  PERSONAL FACTORS: Past/current experiences  and 3+ comorbidities: Osteoporosis, history of falling, depression, chronic pain, obesity, hypertension, fibromyalgia, liver cirrhosis, osteoarthritis hearing loss, and memory deficits  are also affecting patient's functional outcome.   REHAB POTENTIAL: Good  CLINICAL DECISION  MAKING: Evolving/moderate complexity  EVALUATION COMPLEXITY: Moderate   GOALS: Goals reviewed with patient? Yes  SHORT TERM GOALS: Target date: 10/14/23 Patient will be independent with her initial HEP.  Baseline: Goal status: INITIAL  2.  Patient's resting shoulder pain will decrease from 7/10 to 5/10 or less to allow for improved sleep.  Baseline:  Goal status: INITIAL  3.  Patient will be able  to demonstrate at least 115 degrees of active left shoulder flexion for improved function retrieving dishes from her cabinets.  Baseline:  Goal status: INITIAL  4.  Patient will be able to demonstrate at least 90 degrees of active left shoulder abduction for improved functional reaching. Baseline:  Goal status: INITIAL  LONG TERM GOALS: Target date: 11/04/23  Patient will be independent with her advanced HEP.  Baseline:  Goal status: INITIAL  2.  Patient will be able to demonstrate at least 125 degrees of active left shoulder flexion for improved function with overhead ADL's. Baseline:  Goal status: INITIAL  3.  Patient will reduce her pain to at least a 3/10 for improved function sleeping.  Baseline:  Goal status: INITIAL  4.  Patient will be able to carry at least 5 pounds with her left upper extremity for improved function shopping. Baseline:  Goal status: INITIAL  5.  Patient will be able to demonstrate at least 110 degrees of active left shoulder abduction for improved function reaching overhead. Baseline:  Goal status: INITIAL  PLAN:  PT FREQUENCY: 2x/week  PT DURATION: 6 weeks  PLANNED INTERVENTIONS: 97164- PT Re-evaluation, 97110-Therapeutic exercises, 97530- Therapeutic activity, [id number]  Neuromuscular re-education, 97535- Self Care, 16109- Manual therapy, G0283- Electrical stimulation (unattended), 97016- Vasopneumatic device, Patient/Family education, Joint mobilization, Cryotherapy, and Moist heat  PLAN FOR NEXT SESSION: Pulleys, isometrics, active assisted range of motion, manual therapy, and modalities as needed   Anavi Branscum,CHRIS, PTA 09/28/2023, 11:49 AM

## 2023-10-01 ENCOUNTER — Encounter

## 2023-10-02 ENCOUNTER — Ambulatory Visit: Attending: Sports Medicine | Admitting: Physical Therapy

## 2023-10-02 DIAGNOSIS — M25512 Pain in left shoulder: Secondary | ICD-10-CM | POA: Diagnosis present

## 2023-10-02 DIAGNOSIS — M25612 Stiffness of left shoulder, not elsewhere classified: Secondary | ICD-10-CM | POA: Insufficient documentation

## 2023-10-02 NOTE — Therapy (Signed)
 OUTPATIENT PHYSICAL THERAPY UPPER EXTREMITY EVALUATION   Patient Name: Sabrina Faulkner MRN: [id number] DOB:14-Oct-1955, 68 y.o., female Today's Date: 10/02/2023  END OF SESSION:  PT End of Session - 10/02/23 1108     Visit Number 3    Number of Visits 12    Date for PT Re-Evaluation 11/27/23    PT Start Time 1100    PT Stop Time 1156    PT Time Calculation (min) 56 min    Activity Tolerance Patient tolerated treatment well    Behavior During Therapy WFL for tasks assessed/performed             Past Medical History:  Diagnosis Date   Ambulates with cane    straight cane   Anemia    Arthritis    Dyspnea    ocasional SOB   Esophageal varices (HCC)    Fibromyalgia    GERD (gastroesophageal reflux disease)    occassionally   Headache    no current problems as of 12/04/22   Hearing loss    bilateral - no hearing aids   Heart murmur    NEVER HAS CAUSED ANY PROBLEMS   Hepatic cirrhosis (HCC)    History of blood transfusion    x 1   Hypercholesteremia    Hypertension    Hypothyroidism    Sleep apnea    does not wear CPAP   Past Surgical History:  Procedure Laterality Date   ABDOMINAL HYSTERECTOMY     ANTERIOR CERVICAL DECOMP/DISCECTOMY FUSION N/A 12/23/2016   Procedure: Cervical Five-Six, Cervical Six-Seven Anterior discectomy with fusion and plate fixation;  Surgeon: Ditty, Loura Halt, MD;  Location: [facility] OR;  Service: Neurosurgery;  Laterality: N/A;  C5-6 C6-7 Anterior discectomy with fusion and plate fixation   arthroscopic knee Right    bladder stimulatulation insertion     has remote - will bring on DOS 12/04/22   CARPAL TUNNEL RELEASE Right    COLONOSCOPY     INCONTINENCE SURGERY     bladder sling   livery biopsy     TONSILLECTOMY     TOTAL KNEE ARTHROPLASTY Left 12/15/2022   Procedure: LEFT TOTAL KNEE ARTHROPLASTY;  Surgeon: Tarry Kos, MD;  Location: [facility] OR;  Service: Orthopedics;  Laterality: Left;   ttrigger finger Right    UPPER ENDOSCOPY W/ BANDING      x several   WRIST FRACTURE SURGERY Right    Patient Active Problem List   Diagnosis Date Noted   Status post total left knee replacement 12/15/2022   Primary osteoarthritis of left knee 12/14/2022   REFERRING PROVIDER: Frederico Hamman, PA-C   REFERRING DIAG: Unspecified fracture of upper end of left humerus, initial encounter for closed fracture   THERAPY DIAG:  Acute pain of left shoulder  Stiffness of left shoulder, not elsewhere classified  Rationale for Evaluation and Treatment: Rehabilitation  ONSET DATE: 08/27/23  SUBJECTIVE:   SUBJECTIVE STATEMENT: Pain at a 5 today.  PERTINENT HISTORY: Osteoporosis, history of falling, depression, chronic pain, obesity, hypertension, fibromyalgia, liver cirrhosis, osteoarthritis hearing loss, and memory deficits PAIN:  Are you having pain? Yes: NPRS scale: 5/10 Pain location: left upper arm and shoulder  Pain description: Sharp, Sore, Throbbing Aggravating factors: Moving the arm in general Relieving factors: Having the arm resting on stomach or supported  PRECAUTIONS: Shoulder and Fall; no lifting with left arm   RED FLAGS: None   WEIGHT BEARING RESTRICTIONS: No  FALLS:  Has patient fallen in last 6 months? Yes. Number  of falls 1  LIVING ENVIRONMENT: Lives with: lives with their spouse Lives in: House/apartment Stairs: Yes: Internal: 20 steps; on left going up Has following equipment at home: Single point cane  OCCUPATION: Retired  PLOF: Independent with community mobility with device  PATIENT GOALS: Decrease pain, Sleep longer, improve strength.   NEXT MD VISIT: 4 weeks from 09/23/23  OBJECTIVE:  Note: Objective measures were completed at Evaluation unless otherwise noted.  COGNITION: Overall cognitive status: Within functional limits for tasks assessed     SENSATION: Patient reports no numbness or tinging  PALPATION: No tenderness to palpation  JOINT MOBILITY:  Left glenohumeral: hypomobile and  nonpainful   UPPER EXTREMITY ROM: AROM and PROM assessed in supine  Active ROM Right eval Left eval  Shoulder flexion 126 107 (AAROM) PROM: 127  Shoulder extension    Shoulder abduction 147 73 PROM: 90  Shoulder adduction    Shoulder extension    Shoulder internal rotation    Shoulder external rotation    Elbow flexion    Elbow extension    Wrist flexion    Wrist extension    Wrist ulnar deviation    Wrist radial deviation    Wrist pronation    Wrist supination     (Blank rows = not tested)   UPPER EXTREMITY MMT: not tested at this time due to pain severity                                                                                     TREATMENT DATE:     10/02/23:                                       EXERCISE LOG  Exercise Repetitions and Resistance Comments  Nustep  Level 1 x 10 minutes    Pulleys 5 minutes   Seated UE Ranger 5 minutes   Wall ladder 3 minutes       In supine;  Gentle passive range of motion into left shoulder flexion x 5 minutes f/b  HMP and IFC at 80-150 Hz on 40% scan x 20 minutes.     09-28-23                                    EXERCISE LOG   LT shldr  Exercise Repetitions and Resistance Comments  Nustep  L1 x  11  mins   seat   Pulleys X 5 mins   UE Ranger X  5 mins   Table slides  Flexion/ ext, CW/CCW   x 10   Wall slides  Flexion x10 with RT UE assist    Blank cell = exercise not performed today  Manual AAROM for elevation and ER IFC x 15 mins 80-150hz  100% scan with HMP x 15 mins LT shldr seated. Discussed HEP    PATIENT EDUCATION:  Education details: Healing, prognosis, anatomy, and goals for physical therapy Person educated: Patient Education method: Explanation Education comprehension: verbalized understanding  HOME EXERCISE PROGRAM:  ASSESSMENT:  CLINICAL IMPRESSION: The patient did very well with treatment and her left shoulder range of motion is improving nicely.  He discussed that she use a cane at all times  for increased safety while ambulating.    OBJECTIVE IMPAIRMENTS: decreased activity tolerance, decreased knowledge of condition, decreased ROM, decreased strength, and pain.   ACTIVITY LIMITATIONS: carrying, lifting, sleeping, and reach over head  PARTICIPATION LIMITATIONS: meal prep, cleaning, laundry, shopping, and community activity  PERSONAL FACTORS: Past/current experiences and 3+ comorbidities: Osteoporosis, history of falling, depression, chronic pain, obesity, hypertension, fibromyalgia, liver cirrhosis, osteoarthritis hearing loss, and memory deficits  are also affecting patient's functional outcome.   REHAB POTENTIAL: Good  CLINICAL DECISION MAKING: Evolving/moderate complexity  EVALUATION COMPLEXITY: Moderate   GOALS: Goals reviewed with patient? Yes  SHORT TERM GOALS: Target date: 10/14/23 Patient will be independent with her initial HEP.  Baseline: Goal status: INITIAL  2.  Patient's resting shoulder pain will decrease from 7/10 to 5/10 or less to allow for improved sleep.  Baseline:  Goal status: INITIAL  3.  Patient will be able  to demonstrate at least 115 degrees of active left shoulder flexion for improved function retrieving dishes from her cabinets.  Baseline:  Goal status: INITIAL  4.  Patient will be able to demonstrate at least 90 degrees of active left shoulder abduction for improved functional reaching. Baseline:  Goal status: INITIAL  LONG TERM GOALS: Target date: 11/04/23  Patient will be independent with her advanced HEP.  Baseline:  Goal status: INITIAL  2.  Patient will be able to demonstrate at least 125 degrees of active left shoulder flexion for improved function with overhead ADL's. Baseline:  Goal status: INITIAL  3.  Patient will reduce her pain to at least a 3/10 for improved function sleeping.  Baseline:  Goal status: INITIAL  4.  Patient will be able to carry at least 5 pounds with her left upper extremity for improved function  shopping. Baseline:  Goal status: INITIAL  5.  Patient will be able to demonstrate at least 110 degrees of active left shoulder abduction for improved function reaching overhead. Baseline:  Goal status: INITIAL  PLAN:  PT FREQUENCY: 2x/week  PT DURATION: 6 weeks  PLANNED INTERVENTIONS: 97164- PT Re-evaluation, 97110-Therapeutic exercises, 97530- Therapeutic activity, [id number]- Neuromuscular re-education, 97535- Self Care, 16109- Manual therapy, [id number]- Electrical stimulation (unattended), 97016- Vasopneumatic device, Patient/Family education, Joint mobilization, Cryotherapy, and Moist heat  PLAN FOR NEXT SESSION: Pulleys, isometrics, active assisted range of motion, manual therapy, and modalities as needed   Symphonie Schneiderman, [place], PT 10/02/2023, 1:02 PM

## 2023-10-05 ENCOUNTER — Ambulatory Visit

## 2023-10-05 DIAGNOSIS — M25512 Pain in left shoulder: Secondary | ICD-10-CM

## 2023-10-05 DIAGNOSIS — M25612 Stiffness of left shoulder, not elsewhere classified: Secondary | ICD-10-CM

## 2023-10-05 NOTE — Therapy (Signed)
 OUTPATIENT PHYSICAL THERAPY UPPER EXTREMITY TREATMENT   Patient Name: Sabrina Faulkner MRN: [id number] DOB:Sep 17, 1955, 68 y.o., female Today's Date: 10/05/2023  END OF SESSION:  PT End of Session - 10/05/23 0802     Visit Number 4    Number of Visits 12    Date for PT Re-Evaluation 11/27/23    PT Start Time 0800    PT Stop Time 0901    PT Time Calculation (min) 61 min    Activity Tolerance Patient tolerated treatment well    Behavior During Therapy [facility] for tasks assessed/performed             Past Medical History:  Diagnosis Date   Ambulates with cane    straight cane   Anemia    Arthritis    Dyspnea    ocasional SOB   Esophageal varices (HCC)    Fibromyalgia    GERD (gastroesophageal reflux disease)    occassionally   Headache    no current problems as of 12/04/22   Hearing loss    bilateral - no hearing aids   Heart murmur    NEVER HAS CAUSED ANY PROBLEMS   Hepatic cirrhosis (HCC)    History of blood transfusion    x 1   Hypercholesteremia    Hypertension    Hypothyroidism    Sleep apnea    does not wear CPAP   Past Surgical History:  Procedure Laterality Date   ABDOMINAL HYSTERECTOMY     ANTERIOR CERVICAL DECOMP/DISCECTOMY FUSION N/A 12/23/2016   Procedure: Cervical Five-Six, Cervical Six-Seven Anterior discectomy with fusion and plate fixation;  Surgeon: Ditty, Loura Halt, MD;  Location: [facility] OR;  Service: Neurosurgery;  Laterality: N/A;  C5-6 C6-7 Anterior discectomy with fusion and plate fixation   arthroscopic knee Right    bladder stimulatulation insertion     has remote - will bring on DOS 12/04/22   CARPAL TUNNEL RELEASE Right    COLONOSCOPY     INCONTINENCE SURGERY     bladder sling   livery biopsy     TONSILLECTOMY     TOTAL KNEE ARTHROPLASTY Left 12/15/2022   Procedure: LEFT TOTAL KNEE ARTHROPLASTY;  Surgeon: Tarry Kos, MD;  Location: [facility] OR;  Service: Orthopedics;  Laterality: Left;   ttrigger finger Right    UPPER ENDOSCOPY W/ BANDING      x several   WRIST FRACTURE SURGERY Right    Patient Active Problem List   Diagnosis Date Noted   Status post total left knee replacement 12/15/2022   Primary osteoarthritis of left knee 12/14/2022   REFERRING PROVIDER: Frederico Hamman, PA-C   REFERRING DIAG: Unspecified fracture of upper end of left humerus, initial encounter for closed fracture   THERAPY DIAG:  Acute pain of left shoulder  Stiffness of left shoulder, not elsewhere classified  Rationale for Evaluation and Treatment: Rehabilitation  ONSET DATE: 08/27/23  SUBJECTIVE:   SUBJECTIVE STATEMENT: Pt reports 7/10 left shoulder pain.    PERTINENT HISTORY: Osteoporosis, history of falling, depression, chronic pain, obesity, hypertension, fibromyalgia, liver cirrhosis, osteoarthritis hearing loss, and memory deficits PAIN:  Are you having pain? Yes: NPRS scale: 7/10 Pain location: left upper arm and shoulder  Pain description: Sharp, Sore, Throbbing Aggravating factors: Moving the arm in general Relieving factors: Having the arm resting on stomach or supported  PRECAUTIONS: Shoulder and Fall; no lifting with left arm   RED FLAGS: None   WEIGHT BEARING RESTRICTIONS: No  FALLS:  Has patient fallen in last 6  months? Yes. Number of falls 1  LIVING ENVIRONMENT: Lives with: lives with their spouse Lives in: House/apartment Stairs: Yes: Internal: 20 steps; on left going up Has following equipment at home: Single point cane  OCCUPATION: Retired  PLOF: Independent with community mobility with device  PATIENT GOALS: Decrease pain, Sleep longer, improve strength.   NEXT MD VISIT: 4 weeks from 09/23/23  OBJECTIVE:  Note: Objective measures were completed at Evaluation unless otherwise noted.  COGNITION: Overall cognitive status: Within functional limits for tasks assessed     SENSATION: Patient reports no numbness or tinging  PALPATION: No tenderness to palpation  JOINT MOBILITY:  Left glenohumeral:  hypomobile and nonpainful   UPPER EXTREMITY ROM: AROM and PROM assessed in supine  Active ROM Right eval Left eval  Shoulder flexion 126 107 (AAROM) PROM: 127  Shoulder extension    Shoulder abduction 147 73 PROM: 90  Shoulder adduction    Shoulder extension    Shoulder internal rotation    Shoulder external rotation    Elbow flexion    Elbow extension    Wrist flexion    Wrist extension    Wrist ulnar deviation    Wrist radial deviation    Wrist pronation    Wrist supination     (Blank rows = not tested)   UPPER EXTREMITY MMT: not tested at this time due to pain severity                                                                                     TREATMENT DATE:    10/05/23:                                   EXERCISE LOG  Exercise Repetitions and Resistance Comments  Nustep  Level 3 x 11 minutes    Pulleys 5 minutes   Seated UE Ranger Flex/ext; CW and CCW circles x 2 mins each   Wall ladder    Wall Wash 3 mins    Manual Therapy Soft Tissue Mobilization: left shoulder, STW/M to left deltoid and bicep to decrease pain and tone with pt in supine for comfort    Modalities  Date:  Unattended Estim: Shoulder, IFC 80-150 Hz, 15 mins, Pain and Tone Hot Pack: Shoulder, 15 mins, Pain and Tone    09-28-23                                    EXERCISE LOG   LT shldr  Exercise Repetitions and Resistance Comments  Nustep  L1 x  11  mins   seat   Pulleys X 5 mins   UE Ranger X  5 mins   Table slides  Flexion/ ext, CW/CCW   x 10   Wall slides  Flexion x10 with RT UE assist    Blank cell = exercise not performed today  Manual AAROM for elevation and ER IFC x 15 mins 80-150hz  100% scan with HMP x 15 mins LT shldr seated. Discussed HEP    PATIENT  EDUCATION:  Education details: Healing, prognosis, anatomy, and goals for physical therapy Person educated: Patient Education method: Explanation Education comprehension: verbalized understanding  HOME EXERCISE  PROGRAM:   ASSESSMENT:  CLINICAL IMPRESSION: Pt arrives for today's treatment session reporting 7/10 left shoulder pain.  Pt reports that shoulder has been throbbing this morning due to rainy weather.  Pt introduced to wall washing today with fatigue noted at completion of reps.  STW/M performed to left deltoid and bicep to decrease pain and tone.  Normal responses to estim and MH noted upon removal.  Pt reported 4/10 left shoulder pain at completion of today's treatment session.    OBJECTIVE IMPAIRMENTS: decreased activity tolerance, decreased knowledge of condition, decreased ROM, decreased strength, and pain.   ACTIVITY LIMITATIONS: carrying, lifting, sleeping, and reach over head  PARTICIPATION LIMITATIONS: meal prep, cleaning, laundry, shopping, and community activity  PERSONAL FACTORS: Past/current experiences and 3+ comorbidities: Osteoporosis, history of falling, depression, chronic pain, obesity, hypertension, fibromyalgia, liver cirrhosis, osteoarthritis hearing loss, and memory deficits  are also affecting patient's functional outcome.   REHAB POTENTIAL: Good  CLINICAL DECISION MAKING: Evolving/moderate complexity  EVALUATION COMPLEXITY: Moderate   GOALS: Goals reviewed with patient? Yes  SHORT TERM GOALS: Target date: 10/14/23 Patient will be independent with her initial HEP.  Baseline: Goal status: INITIAL  2.  Patient's resting shoulder pain will decrease from 7/10 to 5/10 or less to allow for improved sleep.  Baseline:  Goal status: INITIAL  3.  Patient will be able  to demonstrate at least 115 degrees of active left shoulder flexion for improved function retrieving dishes from her cabinets.  Baseline:  Goal status: INITIAL  4.  Patient will be able to demonstrate at least 90 degrees of active left shoulder abduction for improved functional reaching. Baseline:  Goal status: INITIAL  LONG TERM GOALS: Target date: 11/04/23  Patient will be independent with her  advanced HEP.  Baseline:  Goal status: INITIAL  2.  Patient will be able to demonstrate at least 125 degrees of active left shoulder flexion for improved function with overhead ADL's. Baseline:  Goal status: INITIAL  3.  Patient will reduce her pain to at least a 3/10 for improved function sleeping.  Baseline:  Goal status: INITIAL  4.  Patient will be able to carry at least 5 pounds with her left upper extremity for improved function shopping. Baseline:  Goal status: INITIAL  5.  Patient will be able to demonstrate at least 110 degrees of active left shoulder abduction for improved function reaching overhead. Baseline:  Goal status: INITIAL  PLAN:  PT FREQUENCY: 2x/week  PT DURATION: 6 weeks  PLANNED INTERVENTIONS: 97164- PT Re-evaluation, 97110-Therapeutic exercises, 97530- Therapeutic activity, 97112- Neuromuscular re-education, 97535- Self Care, 16109- Manual therapy, [id number]- Electrical stimulation (unattended), 97016- Vasopneumatic device, Patient/Family education, Joint mobilization, Cryotherapy, and Moist heat  PLAN FOR NEXT SESSION: Pulleys, isometrics, active assisted range of motion, manual therapy, and modalities as needed   Newman Pies, PTA 10/05/2023, 9:02 AM

## 2023-10-07 ENCOUNTER — Ambulatory Visit: Admitting: Physical Therapy

## 2023-10-07 DIAGNOSIS — M25612 Stiffness of left shoulder, not elsewhere classified: Secondary | ICD-10-CM

## 2023-10-07 DIAGNOSIS — M25512 Pain in left shoulder: Secondary | ICD-10-CM

## 2023-10-07 NOTE — Therapy (Signed)
 OUTPATIENT PHYSICAL THERAPY UPPER EXTREMITY TREATMENT   Patient Name: Sabrina Faulkner MRN: [id number] DOB:1955/09/10, 68 y.o., female Today's Date: 10/07/2023  END OF SESSION:  PT End of Session - 10/07/23 0813     Visit Number 5    Number of Visits 10    Date for PT Re-Evaluation 11/27/23    PT Start Time 0801    PT Stop Time 0844    PT Time Calculation (min) 43 min    Activity Tolerance Patient tolerated treatment well    Behavior During Therapy [facility] for tasks assessed/performed             Past Medical History:  Diagnosis Date   Ambulates with cane    straight cane   Anemia    Arthritis    Dyspnea    ocasional SOB   Esophageal varices (HCC)    Fibromyalgia    GERD (gastroesophageal reflux disease)    occassionally   Headache    no current problems as of 12/04/22   Hearing loss    bilateral - no hearing aids   Heart murmur    NEVER HAS CAUSED ANY PROBLEMS   Hepatic cirrhosis (HCC)    History of blood transfusion    x 1   Hypercholesteremia    Hypertension    Hypothyroidism    Sleep apnea    does not wear CPAP   Past Surgical History:  Procedure Laterality Date   ABDOMINAL HYSTERECTOMY     ANTERIOR CERVICAL DECOMP/DISCECTOMY FUSION N/A 12/23/2016   Procedure: Cervical Five-Six, Cervical Six-Seven Anterior discectomy with fusion and plate fixation;  Surgeon: Ditty, Loura Halt, MD;  Location: [facility] OR;  Service: Neurosurgery;  Laterality: N/A;  C5-6 C6-7 Anterior discectomy with fusion and plate fixation   arthroscopic knee Right    bladder stimulatulation insertion     has remote - will bring on DOS 12/04/22   CARPAL TUNNEL RELEASE Right    COLONOSCOPY     INCONTINENCE SURGERY     bladder sling   livery biopsy     TONSILLECTOMY     TOTAL KNEE ARTHROPLASTY Left 12/15/2022   Procedure: LEFT TOTAL KNEE ARTHROPLASTY;  Surgeon: Tarry Kos, MD;  Location: [facility] OR;  Service: Orthopedics;  Laterality: Left;   ttrigger finger Right    UPPER ENDOSCOPY W/ BANDING      x several   WRIST FRACTURE SURGERY Right    Patient Active Problem List   Diagnosis Date Noted   Status post total left knee replacement 12/15/2022   Primary osteoarthritis of left knee 12/14/2022   REFERRING PROVIDER: Frederico Hamman, PA-C   REFERRING DIAG: Unspecified fracture of upper end of left humerus, initial encounter for closed fracture   THERAPY DIAG:  Acute pain of left shoulder  Stiffness of left shoulder, not elsewhere classified  Rationale for Evaluation and Treatment: Rehabilitation  ONSET DATE: 08/27/23  SUBJECTIVE:   SUBJECTIVE STATEMENT: Getting better.  Really more sore than hurting.  2/10. PERTINENT HISTORY: Osteoporosis, history of falling, depression, chronic pain, obesity, hypertension, fibromyalgia, liver cirrhosis, osteoarthritis hearing loss, and memory deficits PAIN:  Are you having pain? Yes: NPRS scale: 2/10 Pain location: left upper arm and shoulder  Pain description: Sharp, Sore, Throbbing Aggravating factors: Moving the arm in general Relieving factors: Having the arm resting on stomach or supported  PRECAUTIONS: Shoulder and Fall; no lifting with left arm   RED FLAGS: None   WEIGHT BEARING RESTRICTIONS: No  FALLS:  Has patient fallen in last  6 months? Yes. Number of falls 1  LIVING ENVIRONMENT: Lives with: lives with their spouse Lives in: House/apartment Stairs: Yes: Internal: 20 steps; on left going up Has following equipment at home: Single point cane  OCCUPATION: Retired  PLOF: Independent with community mobility with device  PATIENT GOALS: Decrease pain, Sleep longer, improve strength.   NEXT MD VISIT: 4 weeks from 09/23/23  OBJECTIVE:  Note: Objective measures were completed at Evaluation unless otherwise noted.  COGNITION: Overall cognitive status: Within functional limits for tasks assessed     SENSATION: Patient reports no numbness or tinging  PALPATION: No tenderness to palpation  JOINT  MOBILITY:  Left glenohumeral: hypomobile and nonpainful   UPPER EXTREMITY ROM: AROM and PROM assessed in supine  Active ROM Right eval Left eval  Shoulder flexion 126 107 (AAROM) PROM: 127  Shoulder extension    Shoulder abduction 147 73 PROM: 90  Shoulder adduction    Shoulder extension    Shoulder internal rotation    Shoulder external rotation    Elbow flexion    Elbow extension    Wrist flexion    Wrist extension    Wrist ulnar deviation    Wrist radial deviation    Wrist pronation    Wrist supination     (Blank rows = not tested)   UPPER EXTREMITY MMT: not tested at this time due to pain severity                                                                                     TREATMENT DATE:    10/07/23:                                     EXERCISE LOG  Exercise Repetitions and Resistance Comments  Nustep with UE's Level 3 x 10 minutes   Pulleys  5 minutes   Wall ladder 5 minutes   Wall wash 3 minutes       STW/M x 13 minutes to patient's left deltoid region.    10/05/23:                                   EXERCISE LOG  Exercise Repetitions and Resistance Comments  Nustep  Level 3 x 11 minutes    Pulleys 5 minutes   Seated UE Ranger Flex/ext; CW and CCW circles x 2 mins each   Wall ladder    Wall Wash 3 mins    Manual Therapy Soft Tissue Mobilization: left shoulder, STW/M to left deltoid and bicep to decrease pain and tone with pt in supine for comfort    Modalities  Date:  Unattended Estim: Shoulder, IFC 80-150 Hz, 15 mins, Pain and Tone Hot Pack: Shoulder, 15 mins, Pain and Tone    09-28-23                                    EXERCISE LOG   LT shldr  Exercise  Repetitions and Resistance Comments  Nustep  L1 x  11  mins   seat   Pulleys X 5 mins   UE Ranger X  5 mins   Table slides  Flexion/ ext, CW/CCW   x 10   Wall slides  Flexion x10 with RT UE assist    Blank cell = exercise not performed today  Manual AAROM for elevation and ER IFC x  15 mins 80-150hz  100% scan with HMP x 15 mins LT shldr seated. Discussed HEP    PATIENT EDUCATION:  Education details: Healing, prognosis, anatomy, and goals for physical therapy Person educated: Patient Education method: Explanation Education comprehension: verbalized understanding  HOME EXERCISE PROGRAM:   ASSESSMENT:  CLINICAL IMPRESSION: Patient pleased with her progress and her range of motion is improving nicely.  She is TTP over the left middle deltoid region.  Gentle soft tissue work was very effective and she felt better after treatment.    OBJECTIVE IMPAIRMENTS: decreased activity tolerance, decreased knowledge of condition, decreased ROM, decreased strength, and pain.   ACTIVITY LIMITATIONS: carrying, lifting, sleeping, and reach over head  PARTICIPATION LIMITATIONS: meal prep, cleaning, laundry, shopping, and community activity  PERSONAL FACTORS: Past/current experiences and 3+ comorbidities: Osteoporosis, history of falling, depression, chronic pain, obesity, hypertension, fibromyalgia, liver cirrhosis, osteoarthritis hearing loss, and memory deficits  are also affecting patient's functional outcome.   REHAB POTENTIAL: Good  CLINICAL DECISION MAKING: Evolving/moderate complexity  EVALUATION COMPLEXITY: Moderate   GOALS: Goals reviewed with patient? Yes  SHORT TERM GOALS: Target date: 10/14/23 Patient will be independent with her initial HEP.  Baseline: Goal status: INITIAL  2.  Patient's resting shoulder pain will decrease from 7/10 to 5/10 or less to allow for improved sleep.  Baseline:  Goal status: INITIAL  3.  Patient will be able  to demonstrate at least 115 degrees of active left shoulder flexion for improved function retrieving dishes from her cabinets.  Baseline:  Goal status: INITIAL  4.  Patient will be able to demonstrate at least 90 degrees of active left shoulder abduction for improved functional reaching. Baseline:  Goal status:  INITIAL  LONG TERM GOALS: Target date: 11/04/23  Patient will be independent with her advanced HEP.  Baseline:  Goal status: INITIAL  2.  Patient will be able to demonstrate at least 125 degrees of active left shoulder flexion for improved function with overhead ADL's. Baseline:  Goal status: INITIAL  3.  Patient will reduce her pain to at least a 3/10 for improved function sleeping.  Baseline:  Goal status: INITIAL  4.  Patient will be able to carry at least 5 pounds with her left upper extremity for improved function shopping. Baseline:  Goal status: INITIAL  5.  Patient will be able to demonstrate at least 110 degrees of active left shoulder abduction for improved function reaching overhead. Baseline:  Goal status: INITIAL  PLAN:  PT FREQUENCY: 2x/week  PT DURATION: 6 weeks  PLANNED INTERVENTIONS: 97164- PT Re-evaluation, 97110-Therapeutic exercises, 97530- Therapeutic activity, [id number]- Neuromuscular re-education, 97535- Self Care, 44010- Manual therapy, G0283- Electrical stimulation (unattended), 97016- Vasopneumatic device, Patient/Family education, Joint mobilization, Cryotherapy, and Moist heat  PLAN FOR NEXT SESSION: Pulleys, isometrics, active assisted range of motion, manual therapy, and modalities as needed   Danaysia Rader, [place], PT 10/07/2023, 9:25 AM

## 2023-10-12 ENCOUNTER — Ambulatory Visit: Admitting: Physical Therapy

## 2023-10-12 DIAGNOSIS — M25612 Stiffness of left shoulder, not elsewhere classified: Secondary | ICD-10-CM

## 2023-10-12 DIAGNOSIS — M25512 Pain in left shoulder: Secondary | ICD-10-CM | POA: Diagnosis not present

## 2023-10-12 NOTE — Therapy (Signed)
 OUTPATIENT PHYSICAL THERAPY UPPER EXTREMITY TREATMENT   Patient Name: Sabrina Faulkner MRN: [id number] DOB:August 07, 1955, 68 y.o., female Today's Date: 10/12/2023  END OF SESSION:  PT End of Session - 10/12/23 1003     Visit Number 5    Number of Visits 10    Date for PT Re-Evaluation 11/27/23    PT Start Time 0930    PT Stop Time 1019    PT Time Calculation (min) 49 min    Activity Tolerance Patient tolerated treatment well    Behavior During Therapy WFL for tasks assessed/performed              Past Medical History:  Diagnosis Date   Ambulates with cane    straight cane   Anemia    Arthritis    Dyspnea    ocasional SOB   Esophageal varices (HCC)    Fibromyalgia    GERD (gastroesophageal reflux disease)    occassionally   Headache    no current problems as of 12/04/22   Hearing loss    bilateral - no hearing aids   Heart murmur    NEVER HAS CAUSED ANY PROBLEMS   Hepatic cirrhosis (HCC)    History of blood transfusion    x 1   Hypercholesteremia    Hypertension    Hypothyroidism    Sleep apnea    does not wear CPAP   Past Surgical History:  Procedure Laterality Date   ABDOMINAL HYSTERECTOMY     ANTERIOR CERVICAL DECOMP/DISCECTOMY FUSION N/A 12/23/2016   Procedure: Cervical Five-Six, Cervical Six-Seven Anterior discectomy with fusion and plate fixation;  Surgeon: Ditty, Loura Halt, MD;  Location: [facility] OR;  Service: Neurosurgery;  Laterality: N/A;  C5-6 C6-7 Anterior discectomy with fusion and plate fixation   arthroscopic knee Right    bladder stimulatulation insertion     has remote - will bring on DOS 12/04/22   CARPAL TUNNEL RELEASE Right    COLONOSCOPY     INCONTINENCE SURGERY     bladder sling   livery biopsy     TONSILLECTOMY     TOTAL KNEE ARTHROPLASTY Left 12/15/2022   Procedure: LEFT TOTAL KNEE ARTHROPLASTY;  Surgeon: Tarry Kos, MD;  Location: [facility] OR;  Service: Orthopedics;  Laterality: Left;   ttrigger finger Right    UPPER ENDOSCOPY W/  BANDING     x several   WRIST FRACTURE SURGERY Right    Patient Active Problem List   Diagnosis Date Noted   Status post total left knee replacement 12/15/2022   Primary osteoarthritis of left knee 12/14/2022   REFERRING PROVIDER: Frederico Hamman, PA-C   REFERRING DIAG: Unspecified fracture of upper end of left humerus, initial encounter for closed fracture   THERAPY DIAG:  Acute pain of left shoulder  Stiffness of left shoulder, not elsewhere classified  Rationale for Evaluation and Treatment: Rehabilitation  ONSET DATE: 08/27/23  SUBJECTIVE:   SUBJECTIVE STATEMENT: Sore.  PERTINENT HISTORY: Osteoporosis, history of falling, depression, chronic pain, obesity, hypertension, fibromyalgia, liver cirrhosis, osteoarthritis hearing loss, and memory deficits PAIN:  Are you having pain? Yes: NPRS scale: 2/10 Pain location: left upper arm and shoulder  Pain description: Sharp, Sore, Throbbing Aggravating factors: Moving the arm in general Relieving factors: Having the arm resting on stomach or supported  PRECAUTIONS: Shoulder and Fall; no lifting with left arm   RED FLAGS: None   WEIGHT BEARING RESTRICTIONS: No  FALLS:  Has patient fallen in last 6 months? Yes. Number of falls 1  LIVING ENVIRONMENT: Lives with: lives with their spouse Lives in: House/apartment Stairs: Yes: Internal: 20 steps; on left going up Has following equipment at home: Single point cane  OCCUPATION: Retired  PLOF: Independent with community mobility with device  PATIENT GOALS: Decrease pain, Sleep longer, improve strength.   NEXT MD VISIT: 4 weeks from 09/23/23  OBJECTIVE:  Note: Objective measures were completed at Evaluation unless otherwise noted.  COGNITION: Overall cognitive status: Within functional limits for tasks assessed     SENSATION: Patient reports no numbness or tinging  PALPATION: No tenderness to palpation  JOINT MOBILITY:  Left glenohumeral: hypomobile and  nonpainful   UPPER EXTREMITY ROM: AROM and PROM assessed in supine  Active ROM Right eval Left eval  Shoulder flexion 126 107 (AAROM) PROM: 127  Shoulder extension    Shoulder abduction 147 73 PROM: 90  Shoulder adduction    Shoulder extension    Shoulder internal rotation    Shoulder external rotation    Elbow flexion    Elbow extension    Wrist flexion    Wrist extension    Wrist ulnar deviation    Wrist radial deviation    Wrist pronation    Wrist supination     (Blank rows = not tested)   UPPER EXTREMITY MMT: not tested at this time due to pain severity                                                                                     TREATMENT DATE:    10/12/23:                                       EXERCISE LOG  Exercise Repetitions and Resistance Comments  Nustep Level 3 x 11 minutes   Pulleys 6 minutes   Wall ladder 5 minutes   UE Ranger on wall 5 minutes   Supine cane exercise Bench press and flexion 3 x 10   STW/M x 11 minutes.  10/07/23:                                     EXERCISE LOG  Exercise Repetitions and Resistance Comments  Nustep with UE's Level 3 x 10 minutes   Pulleys  5 minutes   Wall ladder 5 minutes   Wall wash 3 minutes       STW/M x 13 minutes to patient's left deltoid region.      PATIENT EDUCATION:  Education details: See below. Person educated: Patient Education method: Explanation, demo, handout Education comprehension: verbalized understanding  HOME EXERCISE PROGRAM:  WAND FLEXION  - SUPINE -  Repeat 15 Repetitions, Hold 5 Seconds, Complete 2 Sets, Perform 4 Times a Day   ASSESSMENT:  CLINICAL IMPRESSION: Patient doing very well and was able to progress to standing UE Ranger exercise.  Handout provided for supine cane exercises.   OBJECTIVE IMPAIRMENTS: decreased activity tolerance, decreased knowledge of condition, decreased ROM, decreased strength, and pain.   ACTIVITY LIMITATIONS: carrying, lifting, sleeping,  and reach over head  PARTICIPATION LIMITATIONS: meal prep, cleaning, laundry, shopping, and community activity  PERSONAL FACTORS: Past/current experiences and 3+ comorbidities: Osteoporosis, history of falling, depression, chronic pain, obesity, hypertension, fibromyalgia, liver cirrhosis, osteoarthritis hearing loss, and memory deficits  are also affecting patient's functional outcome.   REHAB POTENTIAL: Good  CLINICAL DECISION MAKING: Evolving/moderate complexity  EVALUATION COMPLEXITY: Moderate   GOALS: Goals reviewed with patient? Yes  SHORT TERM GOALS: Target date: 10/14/23 Patient will be independent with her initial HEP.  Baseline: Goal status: INITIAL  2.  Patient's resting shoulder pain will decrease from 7/10 to 5/10 or less to allow for improved sleep.  Baseline:  Goal status: INITIAL  3.  Patient will be able  to demonstrate at least 115 degrees of active left shoulder flexion for improved function retrieving dishes from her cabinets.  Baseline:  Goal status: INITIAL  4.  Patient will be able to demonstrate at least 90 degrees of active left shoulder abduction for improved functional reaching. Baseline:  Goal status: INITIAL  LONG TERM GOALS: Target date: 11/04/23  Patient will be independent with her advanced HEP.  Baseline:  Goal status: INITIAL  2.  Patient will be able to demonstrate at least 125 degrees of active left shoulder flexion for improved function with overhead ADL's. Baseline:  Goal status: INITIAL  3.  Patient will reduce her pain to at least a 3/10 for improved function sleeping.  Baseline:  Goal status: INITIAL  4.  Patient will be able to carry at least 5 pounds with her left upper extremity for improved function shopping. Baseline:  Goal status: INITIAL  5.  Patient will be able to demonstrate at least 110 degrees of active left shoulder abduction for improved function reaching overhead. Baseline:  Goal status: INITIAL  PLAN:  PT  FREQUENCY: 2x/week  PT DURATION: 6 weeks  PLANNED INTERVENTIONS: 97164- PT Re-evaluation, 97110-Therapeutic exercises, 97530- Therapeutic activity, [id number]- Neuromuscular re-education, 97535- Self Care, 16109- Manual therapy, G0283- Electrical stimulation (unattended), 97016- Vasopneumatic device, Patient/Family education, Joint mobilization, Cryotherapy, and Moist heat  PLAN FOR NEXT SESSION: Pulleys, isometrics, active assisted range of motion, manual therapy, and modalities as needed   Dorean Hiebert, [place], PT 10/12/2023, 10:28 AM

## 2023-10-19 ENCOUNTER — Encounter

## 2023-12-08 ENCOUNTER — Ambulatory Visit: Payer: Medicare HMO | Admitting: Orthopaedic Surgery

## 2024-02-05 ENCOUNTER — Ambulatory Visit: Admitting: Orthopaedic Surgery

## 2024-02-05 ENCOUNTER — Other Ambulatory Visit (INDEPENDENT_AMBULATORY_CARE_PROVIDER_SITE_OTHER): Payer: Self-pay

## 2024-02-05 DIAGNOSIS — L03119 Cellulitis of unspecified part of limb: Secondary | ICD-10-CM | POA: Diagnosis not present

## 2024-02-05 DIAGNOSIS — Z96652 Presence of left artificial knee joint: Secondary | ICD-10-CM

## 2024-02-05 MED ORDER — DOXYCYCLINE HYCLATE 100 MG PO TABS: 100.0000 mg | ORAL_TABLET | Freq: Two times a day (BID) | ORAL | 0 refills | Status: AC

## 2024-02-05 NOTE — Progress Notes (Signed)
 Office Visit Note   Patient: Sabrina Faulkner           Date of Birth: [birth year]           MRN: [id number] Visit Date: 02/05/2024              Requested by: Corrington, Kip A, MD [phone] [address] [place],  [place] [address] PCP: Bobbette Coye LABOR, MD   Assessment & Plan: Visit Diagnoses:  1. Status post total left knee replacement   2. Cellulitis of lower extremity, unspecified laterality     Plan: History of Present Illness Sabrina Faulkner is a 68 year old female who presents with persistent knee pain one year after left knee replacement surgery.  She experiences persistent knee pain around the knee area, unchanged since her knee replacement surgery a year ago. The pain is similar to pre-surgery levels and has not improved. Tylenol  is ineffective for pain management. Due to her liver condition, she is concerned about alternative pain medications.  She has redness in both feet, extending from the ankles down, present for several weeks. Her feet appear swollen. She is not wearing compression socks.  Her medical history includes cirrhosis of the liver, with multiple hospitalizations for bleeding and hematemesis.  Physical Exam EXTREMITIES: Feet and ankles appear red, suggestive of cellulitis. MUSCULOSKELETAL: Knee exam normal, scar fully healed, good range of motion.  Assessment and Plan Chronic left knee pain 1 year post total knee arthroplasty Persistent pain one year post-surgery, similar to pre-surgery. X-rays normal. Differential includes unlikely implant loosening or infection. - Order bone scan for implant loosening or infection. - Consider pain management referral if no pain source identified.  Cellulitis of bilateral feet and ankles Redness and possible cellulitis present for weeks. Recent hospitalizations for bleeding and cirrhosis complicate treatment. - Prescribe doxycycline . - Advise follow-up with primary care for further evaluation and management.  Follow-Up  Instructions: No follow-ups on file.   Orders:  Orders Placed This Encounter  Procedures   XR KNEE 3 VIEW LEFT   NM Bone Scan 3 Phase   Meds ordered this encounter  Medications   doxycycline  (VIBRA -TABS) 100 MG tablet    Sig: Take 1 tablet (100 mg total) by mouth 2 (two) times daily.    Dispense:  20 tablet    Refill:  0      Procedures: No procedures performed   Clinical Data: No additional findings.   Subjective: Chief Complaint  Patient presents with   Left Knee - Pain     Left total knee arthroplasty 12/15/2022      HPI  Review of Systems  Constitutional: Negative.   HENT: Negative.    Eyes: Negative.   Respiratory: Negative.    Cardiovascular: Negative.   Endocrine: Negative.   Musculoskeletal: Negative.   Neurological: Negative.   Hematological: Negative.   Psychiatric/Behavioral: Negative.    All other systems reviewed and are negative.    Objective: Vital Signs: There were no vitals taken for this visit.  Physical Exam Vitals and nursing note reviewed.  Constitutional:      Appearance: She is well-developed.  HENT:     Head: Atraumatic.     Nose: Nose normal.  Eyes:     Extraocular Movements: Extraocular movements intact.  Cardiovascular:     Pulses: Normal pulses.  Pulmonary:     Effort: Pulmonary effort is normal.  Abdominal:     Palpations: Abdomen is soft.  Musculoskeletal:     Cervical  back: Neck supple.  Skin:    General: Skin is warm.     Capillary Refill: Capillary refill takes less than 2 seconds.  Neurological:     Mental Status: She is alert. Mental status is at baseline.  Psychiatric:        Behavior: Behavior normal.        Thought Content: Thought content normal.        Judgment: Judgment normal.     Ortho Exam  Specialty Comments:  No specialty comments available.  Imaging: XR KNEE 3 VIEW LEFT Result Date: 02/05/2024 Stable left total knee replacement without complication    PMFS History: Patient  Active Problem List   Diagnosis Date Noted   Status post total left knee replacement 12/15/2022   Primary osteoarthritis of left knee 12/14/2022   Past Medical History:  Diagnosis Date   Ambulates with cane    straight cane   Anemia    Arthritis    Dyspnea    ocasional SOB   Esophageal varices (HCC)    Fibromyalgia    GERD (gastroesophageal reflux disease)    occassionally   Headache    no current problems as of 12/04/22   Hearing loss    bilateral - no hearing aids   Heart murmur    NEVER HAS CAUSED ANY PROBLEMS   Hepatic cirrhosis (HCC)    History of blood transfusion    x 1   Hypercholesteremia    Hypertension    Hypothyroidism    Sleep apnea    does not wear CPAP    No family history on file.  Past Surgical History:  Procedure Laterality Date   ABDOMINAL HYSTERECTOMY     ANTERIOR CERVICAL DECOMP/DISCECTOMY FUSION N/A 12/23/2016   Procedure: Cervical Five-Six, Cervical Six-Seven Anterior discectomy with fusion and plate fixation;  Surgeon: Ditty, Morene Hicks, MD;  Location: [facility] OR;  Service: Neurosurgery;  Laterality: N/A;  C5-6 C6-7 Anterior discectomy with fusion and plate fixation   arthroscopic knee Right    bladder stimulatulation insertion     has remote - will bring on DOS 12/04/22   CARPAL TUNNEL RELEASE Right    COLONOSCOPY     INCONTINENCE SURGERY     bladder sling   livery biopsy     TONSILLECTOMY     TOTAL KNEE ARTHROPLASTY Left 12/15/2022   Procedure: LEFT TOTAL KNEE ARTHROPLASTY;  Surgeon: Jerri Kay HERO, MD;  Location: [facility] OR;  Service: Orthopedics;  Laterality: Left;   ttrigger finger Right    UPPER ENDOSCOPY W/ BANDING     x several   WRIST FRACTURE SURGERY Right    Social History   Occupational History   Not on file  Tobacco Use   Smoking status: Never   Smokeless tobacco: Never  Vaping Use   Vaping status: Never Used  Substance and Sexual Activity   Alcohol use: No   Drug use: No   Sexual activity: Yes    Birth control/protection:  Surgical    Comment: Hysterectomy

## 2024-02-12 ENCOUNTER — Encounter (HOSPITAL_COMMUNITY)
Admission: RE | Admit: 2024-02-12 | Discharge: 2024-02-12 | Disposition: A | Discharge status: Home / Self Care | Source: Ambulatory Visit | Attending: Orthopaedic Surgery | Admitting: Orthopaedic Surgery

## 2024-02-12 ENCOUNTER — Encounter (HOSPITAL_COMMUNITY): Admission: RE | Admit: 2024-02-12 | Source: Ambulatory Visit

## 2024-02-12 DIAGNOSIS — Z96652 Presence of left artificial knee joint: Secondary | ICD-10-CM | POA: Insufficient documentation

## 2024-02-12 DIAGNOSIS — L03119 Cellulitis of unspecified part of limb: Secondary | ICD-10-CM | POA: Insufficient documentation

## 2024-02-23 ENCOUNTER — Ambulatory Visit (HOSPITAL_COMMUNITY)
Admission: RE | Admit: 2024-02-23 | Discharge: 2024-02-23 | Disposition: A | Discharge status: Home / Self Care | Source: Ambulatory Visit | Attending: Orthopaedic Surgery | Admitting: Orthopaedic Surgery

## 2024-02-23 DIAGNOSIS — Z96652 Presence of left artificial knee joint: Secondary | ICD-10-CM | POA: Insufficient documentation

## 2024-02-23 DIAGNOSIS — L03119 Cellulitis of unspecified part of limb: Secondary | ICD-10-CM | POA: Diagnosis present

## 2024-02-23 MED ORDER — TECHNETIUM TC 99M MEDRONATE IV KIT
20.0000 | PACK | Freq: Once | INTRAVENOUS | Status: AC | PRN
  Administered 2024-02-23: 21.8 via INTRAVENOUS

## 2024-03-03 ENCOUNTER — Ambulatory Visit: Payer: Self-pay | Admitting: Orthopaedic Surgery

## 2024-05-02 ENCOUNTER — Encounter: Payer: Self-pay | Admitting: Radiology
